# Patient Record
Sex: Male | Born: 1970
Health system: Southern US, Community
[De-identification: ages and names within clinical notes are randomized; demographics above are authoritative.]

## PROBLEM LIST (undated history)

## (undated) DIAGNOSIS — I10 Essential (primary) hypertension: Secondary | ICD-10-CM

## (undated) DIAGNOSIS — J45909 Unspecified asthma, uncomplicated: Secondary | ICD-10-CM

## (undated) DIAGNOSIS — K219 Gastro-esophageal reflux disease without esophagitis: Secondary | ICD-10-CM

## (undated) DIAGNOSIS — F988 Other specified behavioral and emotional disorders with onset usually occurring in childhood and adolescence: Secondary | ICD-10-CM

## (undated) DIAGNOSIS — E785 Hyperlipidemia, unspecified: Secondary | ICD-10-CM

## (undated) HISTORY — DX: Gastro-esophageal reflux disease without esophagitis: K21.9

## (undated) HISTORY — DX: Hyperlipidemia, unspecified: E78.5

## (undated) HISTORY — DX: Unspecified asthma, uncomplicated: J45.909

## (undated) HISTORY — DX: Other specified behavioral and emotional disorders with onset usually occurring in childhood and adolescence: F98.8

## (undated) HISTORY — PX: VASECTOMY: SHX75

## (undated) HISTORY — DX: Essential (primary) hypertension: I10

---

## 2017-05-06 ENCOUNTER — Encounter: Payer: Self-pay | Admitting: Adult Health

## 2017-05-06 ENCOUNTER — Other Ambulatory Visit: Payer: Self-pay | Admitting: Adult Health

## 2017-05-07 DIAGNOSIS — I10 Essential (primary) hypertension: Secondary | ICD-10-CM | POA: Insufficient documentation

## 2017-05-07 DIAGNOSIS — F988 Other specified behavioral and emotional disorders with onset usually occurring in childhood and adolescence: Secondary | ICD-10-CM | POA: Insufficient documentation

## 2017-05-07 DIAGNOSIS — K219 Gastro-esophageal reflux disease without esophagitis: Secondary | ICD-10-CM | POA: Insufficient documentation

## 2017-05-07 DIAGNOSIS — J45909 Unspecified asthma, uncomplicated: Secondary | ICD-10-CM | POA: Insufficient documentation

## 2017-05-07 DIAGNOSIS — E785 Hyperlipidemia, unspecified: Secondary | ICD-10-CM | POA: Insufficient documentation

## 2017-05-07 NOTE — Progress Notes (Signed)
Patient presents to clinic today to establish care. He is a pleasant 47 year old male who  has a past medical history of ADD (attention deficit disorder), Asthma, Essential hypertension, GERD (gastroesophageal reflux disease), and Hyperlipidemia.   Acute Concerns: Establish Care /CPE   Chronic Issues: Essential hypertension - controlled on lisinopril 10 mg daily   Hyperlipidemia - Controlled on statin   ADD - Takes Adderall 15 mg daily - stable   GERD - Takes omeprazole daily - stable   Asthma - Uses Advair  daily and Albuterol PRN   Health Maintenance: Dental -- Routine  Vision -- Does not do routine care Immunizations -- UTD Colonoscopy -- Never had  Diet: Tries to eat healthy  Exercise: Exercies 3-4 times per week    Past Medical History:  Diagnosis Date  . ADD (attention deficit disorder)   . Asthma   . Essential hypertension   . GERD (gastroesophageal reflux disease)   . Hyperlipidemia       Current Outpatient Medications on File Prior to Visit  Medication Sig Dispense Refill  . lisinopril (PRINIVIL,ZESTRIL) 10 MG tablet Take by mouth.     No current facility-administered medications on file prior to visit.     Allergies  Allergen Reactions  . Amoxicillin Anaphylaxis    Family History  Problem Relation Age of Onset  . Hypertension Mother   . High Cholesterol Mother   . High blood pressure Father   . Hyperlipidemia Father   . Early death Brother        mva    Social History   Socioeconomic History  . Marital status: Significant Other    Spouse name: Not on file  . Number of children: Not on file  . Years of education: Not on file  . Highest education level: Not on file  Occupational History  . Occupation: Academic librarian:   Social Needs  . Financial resource strain: Not on file  . Food insecurity:    Worry: Not on file    Inability: Not on file  . Transportation needs:    Medical: Not on file    Non-medical: Not on  file  Tobacco Use  . Smoking status: Not on file  Substance and Sexual Activity  . Alcohol use: Not on file  . Drug use: Not on file  . Sexual activity: Not on file  Lifestyle  . Physical activity:    Days per week: Not on file    Minutes per session: Not on file  . Stress: Not on file  Relationships  . Social connections:    Talks on phone: Not on file    Gets together: Not on file    Attends religious service: Not on file    Active member of club or organization: Not on file    Attends meetings of clubs or organizations: Not on file    Relationship status: Not on file  . Intimate partner violence:    Fear of current or ex partner: Not on file    Emotionally abused: Not on file    Physically abused: Not on file    Forced sexual activity: Not on file  Other Topics Concern  . Not on file  Social History Narrative  . Not on file    Review of Systems  Constitutional: Negative.   HENT: Negative.   Eyes: Negative.   Respiratory: Negative.   Cardiovascular: Negative.   Gastrointestinal: Negative.   Genitourinary: Negative.  Musculoskeletal: Negative.   Skin: Negative.   Neurological: Negative.   Endo/Heme/Allergies: Negative.   Psychiatric/Behavioral: Negative.   All other systems reviewed and are negative.    BP 100/70   Temp 98.6 F (37 C)   Resp 16   Ht 5' 11.5" (1.816 m)   Wt 215 lb (97.5 kg)   BMI 29.57 kg/m   Physical Exam  Constitutional: He is oriented to person, place, and time. No distress.  HENT:  Head: Normocephalic and atraumatic.  Right Ear: External ear normal.  Left Ear: External ear normal.  Nose: Nose normal.  Mouth/Throat: Oropharynx is clear and moist. No oropharyngeal exudate.  Eyes: Pupils are equal, round, and reactive to light. Conjunctivae and EOM are normal. Right eye exhibits no discharge. Left eye exhibits no discharge. No scleral icterus.  Neck: Normal range of motion. Neck supple. No JVD present. No tracheal deviation present.  No thyromegaly present.  Cardiovascular: Normal rate, regular rhythm, normal heart sounds and intact distal pulses. Exam reveals no gallop and no friction rub.  No murmur heard. Pulmonary/Chest: Effort normal and breath sounds normal. No stridor. No respiratory distress. He has no wheezes. He has no rales. He exhibits no tenderness.  Abdominal: Soft. Bowel sounds are normal. He exhibits no distension and no mass. There is no tenderness. There is no rebound and no guarding.  Musculoskeletal: Normal range of motion. He exhibits no edema, tenderness or deformity.  Lymphadenopathy:    He has no cervical adenopathy.  Neurological: He is alert and oriented to person, place, and time. He has normal reflexes. He displays normal reflexes. No cranial nerve deficit. He exhibits normal muscle tone. Coordination normal.  Skin: Skin is warm and dry. No rash noted. He is not diaphoretic. No erythema. No pallor.  Psychiatric: He has a normal mood and affect. His behavior is normal. Judgment and thought content normal.  Nursing note and vitals reviewed.   Assessment/Plan: 1. Routine general medical examination at a health care facility - Follow up in one year or sooner if need - Continue to exercise and eat healthy  - Basic metabolic panel - CBC with Differential/Platelet - Hemoglobin A1c - Hepatic function panel - Lipid panel - TSH  2. Prostate cancer screening  - PSA  3. Mixed hyperlipidemia - Consider increase in statin  - Basic metabolic panel - CBC with Differential/Platelet - Hemoglobin A1c - Hepatic function panel - Lipid panel - TSH - lovastatin (MEVACOR) 20 MG tablet; Take 1 tablet (20 mg total) by mouth at bedtime.  Dispense: 90 tablet; Refill: 3  4. Gastroesophageal reflux disease, esophagitis presence not specified  - omeprazole (PRILOSEC) 20 MG capsule; Take 1 capsule (20 mg total) by mouth daily.  Dispense: 90 capsule; Refill: 3  5. Essential hypertension - Well controlled.  No change in medications  - Basic metabolic panel - CBC with Differential/Platelet - Hemoglobin A1c - Hepatic function panel - Lipid panel - TSH  6. Mild intermittent asthma without complication  - albuterol (PROVENTIL HFA) 108 (90 Base) MCG/ACT inhaler; Inhale 1-2 puffs into the lungs every 6 (six) hours as needed for wheezing or shortness of breath.  Dispense: 6.7 g; Refill: 3 - Fluticasone-Salmeterol (ADVAIR DISKUS) 250-50 MCG/DOSE AEPB; Inhale 1 puff into the lungs 2 (two) times daily.  Dispense: 60 each; Refill: 11  7. Attention deficit disorder, unspecified hyperactivity presence  - amphetamine-dextroamphetamine (ADDERALL) 15 MG tablet; Take 1 tablet by mouth daily.  Dispense: 30 tablet; Refill: 0 - amphetamine-dextroamphetamine (ADDERALL) 15 MG tablet;  Take 1 tablet by mouth daily.  Dispense: 30 tablet; Refill: 0 - amphetamine-dextroamphetamine (ADDERALL) 15 MG tablet; Take 1 tablet by mouth daily.  Dispense: 30 tablet; Refill: 0   Shirline Frees, NP

## 2017-05-08 ENCOUNTER — Ambulatory Visit (INDEPENDENT_AMBULATORY_CARE_PROVIDER_SITE_OTHER): Payer: 59 | Admitting: Adult Health

## 2017-05-08 ENCOUNTER — Encounter: Payer: Self-pay | Admitting: Adult Health

## 2017-05-08 VITALS — BP 100/70 | Temp 98.6°F | Resp 16 | Ht 71.5 in | Wt 215.0 lb

## 2017-05-08 DIAGNOSIS — Z0000 Encounter for general adult medical examination without abnormal findings: Secondary | ICD-10-CM

## 2017-05-08 DIAGNOSIS — F988 Other specified behavioral and emotional disorders with onset usually occurring in childhood and adolescence: Secondary | ICD-10-CM | POA: Diagnosis not present

## 2017-05-08 DIAGNOSIS — Z125 Encounter for screening for malignant neoplasm of prostate: Secondary | ICD-10-CM

## 2017-05-08 DIAGNOSIS — K219 Gastro-esophageal reflux disease without esophagitis: Secondary | ICD-10-CM | POA: Diagnosis not present

## 2017-05-08 DIAGNOSIS — I10 Essential (primary) hypertension: Secondary | ICD-10-CM

## 2017-05-08 DIAGNOSIS — E782 Mixed hyperlipidemia: Secondary | ICD-10-CM

## 2017-05-08 DIAGNOSIS — J4520 Mild intermittent asthma, uncomplicated: Secondary | ICD-10-CM | POA: Diagnosis not present

## 2017-05-08 LAB — CBC WITH DIFFERENTIAL/PLATELET
BASOS ABS: 0 10*3/uL (ref 0.0–0.1)
Basophils Relative: 0.4 % (ref 0.0–3.0)
Eosinophils Absolute: 0.2 10*3/uL (ref 0.0–0.7)
Eosinophils Relative: 3.6 % (ref 0.0–5.0)
HCT: 44.4 % (ref 39.0–52.0)
HEMOGLOBIN: 15.4 g/dL (ref 13.0–17.0)
LYMPHS ABS: 1.9 10*3/uL (ref 0.7–4.0)
Lymphocytes Relative: 31.1 % (ref 12.0–46.0)
MCHC: 34.8 g/dL (ref 30.0–36.0)
MCV: 92 fl (ref 78.0–100.0)
MONO ABS: 0.4 10*3/uL (ref 0.1–1.0)
Monocytes Relative: 7.2 % (ref 3.0–12.0)
NEUTROS PCT: 57.7 % (ref 43.0–77.0)
Neutro Abs: 3.5 10*3/uL (ref 1.4–7.7)
Platelets: 228 10*3/uL (ref 150.0–400.0)
RBC: 4.83 Mil/uL (ref 4.22–5.81)
RDW: 12.8 % (ref 11.5–15.5)
WBC: 6 10*3/uL (ref 4.0–10.5)

## 2017-05-08 LAB — LIPID PANEL
CHOL/HDL RATIO: 5
Cholesterol: 169 mg/dL (ref 0–200)
HDL: 35.9 mg/dL — AB (ref >39.00)
LDL CALC: 115 mg/dL — AB (ref 0–99)
NONHDL: 132.7
Triglycerides: 89 mg/dL (ref 0.0–149.0)
VLDL: 17.8 mg/dL (ref 0.0–40.0)

## 2017-05-08 LAB — BASIC METABOLIC PANEL
BUN: 18 mg/dL (ref 6–23)
CALCIUM: 9.3 mg/dL (ref 8.4–10.5)
CO2: 24 mEq/L (ref 19–32)
Chloride: 105 mEq/L (ref 96–112)
Creatinine, Ser: 0.91 mg/dL (ref 0.40–1.50)
GFR: 94.83 mL/min (ref >60.00)
GLUCOSE: 96 mg/dL (ref 70–99)
Potassium: 4.4 mEq/L (ref 3.5–5.1)
Sodium: 139 mEq/L (ref 135–145)

## 2017-05-08 LAB — HEPATIC FUNCTION PANEL
ALK PHOS: 65 U/L (ref 39–117)
ALT: 42 U/L (ref 0–53)
AST: 23 U/L (ref 0–37)
Albumin: 4.4 g/dL (ref 3.5–5.2)
BILIRUBIN DIRECT: 0.1 mg/dL (ref 0.0–0.3)
Total Bilirubin: 0.5 mg/dL (ref 0.2–1.2)
Total Protein: 6.7 g/dL (ref 6.0–8.3)

## 2017-05-08 LAB — PSA: PSA: 0.29 ng/mL (ref 0.10–4.00)

## 2017-05-08 LAB — TSH: TSH: 1.58 u[IU]/mL (ref 0.35–4.50)

## 2017-05-08 LAB — HEMOGLOBIN A1C: HEMOGLOBIN A1C: 5.6 % (ref 4.6–6.5)

## 2017-05-08 MED ORDER — AMPHETAMINE-DEXTROAMPHETAMINE 15 MG PO TABS
15.0000 mg | ORAL_TABLET | Freq: Every day | ORAL | 0 refills | Status: DC
Start: 2017-05-08 — End: 2017-12-30

## 2017-05-08 MED ORDER — OMEPRAZOLE 20 MG PO CPDR: 20.0000 mg | DELAYED_RELEASE_CAPSULE | Freq: Every day | ORAL | 3 refills | Status: DC

## 2017-05-08 MED ORDER — FLUTICASONE-SALMETEROL 250-50 MCG/DOSE IN AEPB: 1.0000 | INHALATION_SPRAY | Freq: Two times a day (BID) | RESPIRATORY_TRACT | 11 refills | Status: DC

## 2017-05-08 MED ORDER — ALBUTEROL SULFATE HFA 108 (90 BASE) MCG/ACT IN AERS: 1.0000 | INHALATION_SPRAY | Freq: Four times a day (QID) | RESPIRATORY_TRACT | 3 refills | Status: DC | PRN

## 2017-05-08 MED ORDER — AMPHETAMINE-DEXTROAMPHETAMINE 15 MG PO TABS: 15.0000 mg | ORAL_TABLET | Freq: Every day | ORAL | 0 refills | Status: DC

## 2017-05-08 MED ORDER — LOVASTATIN 20 MG PO TABS: 20.0000 mg | ORAL_TABLET | Freq: Every day | ORAL | 3 refills | Status: DC

## 2017-05-08 NOTE — Patient Instructions (Signed)
It was great meeting you today   I will follow up with you regarding your blood work   All of your medications have been sent to the pharmacy

## 2017-05-21 ENCOUNTER — Other Ambulatory Visit: Payer: Self-pay | Admitting: Adult Health

## 2017-05-21 MED ORDER — LISINOPRIL 10 MG PO TABS: 10.0000 mg | ORAL_TABLET | Freq: Every day | ORAL | 3 refills | Status: DC

## 2017-05-21 MED ORDER — ALBUTEROL SULFATE HFA 108 (90 BASE) MCG/ACT IN AERS: 2.0000 | INHALATION_SPRAY | Freq: Four times a day (QID) | RESPIRATORY_TRACT | 2 refills | Status: DC | PRN

## 2017-05-21 MED FILL — VENTOLIN HFA 90 MCG INHALER: 108 (90 BAS | 30 days supply | Qty: 18 | Fill #0 | Status: TO

## 2017-05-21 MED FILL — LISINOPRIL 10 MG TABLET: 10 | 90 days supply | Qty: 90 | Fill #0

## 2017-05-21 MED FILL — DEXTROAMP-AMPHETAMIN 15 MG: 15 | 30 days supply | Qty: 30 | Fill #0

## 2017-05-21 MED FILL — ADVAIR 250/50 DISKUS: 250-50 | 30 days supply | Qty: 60 | Fill #0 | Status: TO

## 2017-05-21 MED FILL — LOVASTATIN 20 MG TABS: 20 | 90 days supply | Qty: 90 | Fill #0

## 2017-05-21 MED FILL — OMEPRAZOLE 20 MG CAP: 20 | 90 days supply | Qty: 90 | Fill #0

## 2017-06-25 ENCOUNTER — Encounter: Payer: Self-pay | Admitting: Adult Health

## 2017-06-26 ENCOUNTER — Other Ambulatory Visit (INDEPENDENT_AMBULATORY_CARE_PROVIDER_SITE_OTHER): Payer: 59

## 2017-06-26 ENCOUNTER — Other Ambulatory Visit: Payer: Self-pay | Admitting: Family Medicine

## 2017-06-26 DIAGNOSIS — Z0184 Encounter for antibody response examination: Secondary | ICD-10-CM

## 2017-06-27 ENCOUNTER — Ambulatory Visit: Payer: 59

## 2017-06-28 LAB — QUANTIFERON-TB GOLD PLUS
NIL: 0.01 IU/mL
QuantiFERON-TB Gold Plus: NEGATIVE
TB1-NIL: 0 IU/mL
TB2-NIL: 0 IU/mL

## 2017-08-05 ENCOUNTER — Other Ambulatory Visit: Payer: Self-pay | Admitting: Adult Health

## 2017-08-05 MED ORDER — METHYLPREDNISOLONE 4 MG PO TBPK: ORAL_TABLET | ORAL | 0 refills | Status: DC

## 2017-08-05 MED FILL — LISINOPRIL 10 MG TABLET: 10 | 90 days supply | Qty: 90 | Fill #1 | Status: TO

## 2017-08-05 MED FILL — LOVASTATIN 20 MG TABS: 20 | 90 days supply | Qty: 90 | Fill #1 | Status: TO

## 2017-08-05 MED FILL — OMEPRAZOLE 20 MG CAP: 20 | 90 days supply | Qty: 90 | Fill #1 | Status: TO

## 2017-08-05 MED FILL — METHYLPREDNISOLONE 4 MG TAB: 4 | 6 days supply | Qty: 21 | Fill #0

## 2017-11-13 ENCOUNTER — Other Ambulatory Visit: Payer: Self-pay | Admitting: Adult Health

## 2017-11-13 MED ORDER — ACYCLOVIR 400 MG PO TABS: 400.0000 mg | ORAL_TABLET | Freq: Three times a day (TID) | ORAL | 0 refills | Status: DC

## 2017-11-30 ENCOUNTER — Other Ambulatory Visit: Payer: Self-pay | Admitting: Adult Health

## 2017-11-30 MED ORDER — PROMETHAZINE HCL 25 MG PO TABS: 25.0000 mg | ORAL_TABLET | Freq: Three times a day (TID) | ORAL | 1 refills | Status: DC | PRN

## 2017-12-06 ENCOUNTER — Other Ambulatory Visit: Payer: Self-pay | Admitting: Adult Health

## 2017-12-06 NOTE — Telephone Encounter (Signed)
Sent to the pharmacy by e-scribe. 

## 2017-12-30 ENCOUNTER — Other Ambulatory Visit: Payer: Self-pay | Admitting: Adult Health

## 2017-12-30 DIAGNOSIS — F988 Other specified behavioral and emotional disorders with onset usually occurring in childhood and adolescence: Secondary | ICD-10-CM

## 2017-12-30 MED ORDER — AMPHETAMINE-DEXTROAMPHETAMINE 15 MG PO TABS: 15.0000 mg | ORAL_TABLET | Freq: Every day | ORAL | 0 refills | Status: DC

## 2018-04-08 ENCOUNTER — Other Ambulatory Visit: Payer: Self-pay | Admitting: Adult Health

## 2018-04-08 DIAGNOSIS — E782 Mixed hyperlipidemia: Secondary | ICD-10-CM

## 2018-04-08 DIAGNOSIS — K219 Gastro-esophageal reflux disease without esophagitis: Secondary | ICD-10-CM

## 2018-04-08 MED ORDER — OMEPRAZOLE 20 MG PO CPDR: 20.0000 mg | DELAYED_RELEASE_CAPSULE | Freq: Every day | ORAL | 3 refills | Status: DC

## 2018-04-08 MED ORDER — LISINOPRIL 10 MG PO TABS: 10.0000 mg | ORAL_TABLET | Freq: Every day | ORAL | 3 refills | Status: DC

## 2018-04-08 MED ORDER — LOVASTATIN 20 MG PO TABS: 20.0000 mg | ORAL_TABLET | Freq: Every day | ORAL | 3 refills | Status: DC

## 2018-04-22 ENCOUNTER — Encounter: Payer: Self-pay | Admitting: Adult Health

## 2018-04-22 ENCOUNTER — Other Ambulatory Visit: Payer: Self-pay

## 2018-04-22 ENCOUNTER — Ambulatory Visit (INDEPENDENT_AMBULATORY_CARE_PROVIDER_SITE_OTHER): Payer: BLUE CROSS/BLUE SHIELD | Admitting: Adult Health

## 2018-04-22 DIAGNOSIS — J189 Pneumonia, unspecified organism: Secondary | ICD-10-CM

## 2018-04-22 MED ORDER — ALBUTEROL SULFATE (2.5 MG/3ML) 0.083% IN NEBU: 2.5000 mg | INHALATION_SOLUTION | Freq: Four times a day (QID) | RESPIRATORY_TRACT | 1 refills | Status: DC | PRN

## 2018-04-22 MED ORDER — ALBUTEROL SULFATE HFA 108 (90 BASE) MCG/ACT IN AERS: 2.0000 | INHALATION_SPRAY | Freq: Four times a day (QID) | RESPIRATORY_TRACT | 2 refills | Status: DC | PRN

## 2018-04-22 MED ORDER — DOXYCYCLINE HYCLATE 100 MG PO CAPS: 100.0000 mg | ORAL_CAPSULE | Freq: Two times a day (BID) | ORAL | 0 refills | Status: DC

## 2018-04-22 NOTE — Progress Notes (Signed)
Virtual Visit via Video Note  I connected with Johnny Michael on 04/22/18 at  2:00 PM EDT by a video enabled telemedicine application and verified that I am speaking with the correct person using two identifiers.  Location patient: home Location provider:work or home office Persons participating in the virtual visit: patient, provider  I discussed the limitations of evaluation and management by telemedicine and the availability of in person appointments. The patient expressed understanding and agreed to proceed.   HPI: 48 year old male who is being evaluated today for concern of upper respiratory infection.  His symptoms are did approximately 3 to 4 days ago and include low-grade fever up to 99.9, shortness of breath and wheezing with exertion- having to use his rescue inhaler more frequently, productive cough with green sputum, and a feeling of "rattling in my chest "when he coughs.  He does have a history of pneumonia and feels as though this feels similar.  He denies any chest pain, nausea, vomiting, or diarrhea body aches, or sore throat.  He does work from home   ROS: See pertinent positives and negatives per HPI.  Past Medical History:  Diagnosis Date  . ADD (attention deficit disorder)   . Asthma   . Essential hypertension   . GERD (gastroesophageal reflux disease)   . Hyperlipidemia     Past Surgical History:  Procedure Laterality Date  . VASECTOMY      Family History  Problem Relation Age of Onset  . Hypertension Mother   . High Cholesterol Mother   . High blood pressure Father   . Hyperlipidemia Father   . Early death Brother        mva      Current Outpatient Medications:  .  albuterol (PROVENTIL HFA;VENTOLIN HFA) 108 (90 Base) MCG/ACT inhaler, Inhale 2 puffs into the lungs every 6 (six) hours as needed for wheezing or shortness of breath., Disp: 1 Inhaler, Rfl: 2 .  albuterol (PROVENTIL) (2.5 MG/3ML) 0.083% nebulizer solution, Take 3 mLs (2.5 mg total) by  nebulization every 6 (six) hours as needed for wheezing or shortness of breath., Disp: 150 mL, Rfl: 1 .  amphetamine-dextroamphetamine (ADDERALL) 15 MG tablet, Take 1 tablet by mouth daily., Disp: 30 tablet, Rfl: 0 .  amphetamine-dextroamphetamine (ADDERALL) 15 MG tablet, Take 1 tablet by mouth daily., Disp: 30 tablet, Rfl: 0 .  amphetamine-dextroamphetamine (ADDERALL) 15 MG tablet, Take 1 tablet by mouth daily., Disp: 30 tablet, Rfl: 0 .  doxycycline (VIBRAMYCIN) 100 MG capsule, Take 1 capsule (100 mg total) by mouth 2 (two) times daily., Disp: 20 capsule, Rfl: 0 .  Fluticasone-Salmeterol (ADVAIR DISKUS) 250-50 MCG/DOSE AEPB, Inhale 1 puff into the lungs 2 (two) times daily., Disp: 60 each, Rfl: 11 .  lisinopril (PRINIVIL,ZESTRIL) 10 MG tablet, Take 1 tablet (10 mg total) by mouth daily., Disp: 90 tablet, Rfl: 3 .  lovastatin (MEVACOR) 20 MG tablet, Take 1 tablet (20 mg total) by mouth at bedtime., Disp: 90 tablet, Rfl: 3 .  omeprazole (PRILOSEC) 20 MG capsule, Take 1 capsule (20 mg total) by mouth daily., Disp: 90 capsule, Rfl: 3  EXAM:  VITALS per patient if applicable:  GENERAL: alert, oriented, appears well and in no acute distress  HEENT: atraumatic, conjunttiva clear, no obvious abnormalities on inspection of external nose and ears  NECK: normal movements of the head and neck  LUNGS: on inspection no signs of respiratory distress, breathing rate appears normal, no obvious gross SOB, gasping or wheezing  CV: no obvious cyanosis  MS: moves all visible extremities without noticeable abnormality  PSYCH/NEURO: pleasant and cooperative, no obvious depression or anxiety, speech and thought processing grossly intact  ASSESSMENT AND PLAN:  Discussed the following assessment and plan:  Pneumonia due to infectious organism, unspecified laterality, unspecified part of lung - Plan: doxycycline (VIBRAMYCIN) 100 MG capsule, albuterol (PROVENTIL HFA;VENTOLIN HFA) 108 (90 Base) MCG/ACT inhaler,  albuterol (PROVENTIL) (2.5 MG/3ML) 0.083% nebulizer solution     I discussed the assessment and treatment plan with the patient. The patient was provided an opportunity to ask questions and all were answered. The patient agreed with the plan and demonstrated an understanding of the instructions.   The patient was advised to call back or seek an in-person evaluation if the symptoms worsen or if the condition fails to improve as anticipated.    Shirline Frees, NP

## 2018-04-28 ENCOUNTER — Other Ambulatory Visit: Payer: Self-pay | Admitting: Adult Health

## 2018-04-28 DIAGNOSIS — R0602 Shortness of breath: Secondary | ICD-10-CM

## 2018-04-29 ENCOUNTER — Other Ambulatory Visit: Payer: Self-pay | Admitting: Family Medicine

## 2018-04-29 ENCOUNTER — Other Ambulatory Visit: Payer: Self-pay

## 2018-04-29 ENCOUNTER — Ambulatory Visit
Admission: RE | Admit: 2018-04-29 | Discharge: 2018-04-29 | Disposition: A | Discharge status: Home / Self Care | Payer: BLUE CROSS/BLUE SHIELD | Source: Ambulatory Visit | Attending: Adult Health | Admitting: Adult Health

## 2018-04-29 ENCOUNTER — Other Ambulatory Visit (INDEPENDENT_AMBULATORY_CARE_PROVIDER_SITE_OTHER): Payer: BLUE CROSS/BLUE SHIELD

## 2018-04-29 ENCOUNTER — Other Ambulatory Visit: Payer: Self-pay | Admitting: Adult Health

## 2018-04-29 DIAGNOSIS — R0602 Shortness of breath: Secondary | ICD-10-CM

## 2018-04-29 LAB — CBC WITH DIFFERENTIAL/PLATELET
Basophils Absolute: 0 10*3/uL (ref 0.0–0.1)
Basophils Relative: 0.5 % (ref 0.0–3.0)
Eosinophils Absolute: 0.2 10*3/uL (ref 0.0–0.7)
Eosinophils Relative: 2.4 % (ref 0.0–5.0)
HCT: 43.7 % (ref 39.0–52.0)
Hemoglobin: 15.1 g/dL (ref 13.0–17.0)
Lymphocytes Relative: 28.4 % (ref 12.0–46.0)
Lymphs Abs: 2.2 10*3/uL (ref 0.7–4.0)
MCHC: 34.6 g/dL (ref 30.0–36.0)
MCV: 93 fl (ref 78.0–100.0)
Monocytes Absolute: 0.6 10*3/uL (ref 0.1–1.0)
Monocytes Relative: 7.8 % (ref 3.0–12.0)
Neutro Abs: 4.6 10*3/uL (ref 1.4–7.7)
Neutrophils Relative %: 60.9 % (ref 43.0–77.0)
Platelets: 260 10*3/uL (ref 150.0–400.0)
RBC: 4.7 Mil/uL (ref 4.22–5.81)
RDW: 13.1 % (ref 11.5–15.5)
WBC: 7.6 10*3/uL (ref 4.0–10.5)

## 2018-04-29 LAB — HEPATIC FUNCTION PANEL
ALT: 41 U/L (ref 0–53)
AST: 19 U/L (ref 0–37)
Albumin: 4.6 g/dL (ref 3.5–5.2)
Alkaline Phosphatase: 71 U/L (ref 39–117)
Bilirubin, Direct: 0.1 mg/dL (ref 0.0–0.3)
Total Bilirubin: 0.3 mg/dL (ref 0.2–1.2)
Total Protein: 6.8 g/dL (ref 6.0–8.3)

## 2018-04-29 LAB — BASIC METABOLIC PANEL
BUN: 16 mg/dL (ref 6–23)
CO2: 25 mEq/L (ref 19–32)
Calcium: 9.7 mg/dL (ref 8.4–10.5)
Chloride: 105 mEq/L (ref 96–112)
Creatinine, Ser: 0.9 mg/dL (ref 0.40–1.50)
GFR: 89.99 mL/min (ref >60.00)
Glucose, Bld: 80 mg/dL (ref 70–99)
Potassium: 4.3 mEq/L (ref 3.5–5.1)
Sodium: 139 mEq/L (ref 135–145)

## 2018-04-29 MED ORDER — ONDANSETRON HCL 4 MG PO TABS: 4.0000 mg | ORAL_TABLET | Freq: Three times a day (TID) | ORAL | 3 refills | Status: DC | PRN

## 2018-04-30 DIAGNOSIS — R0602 Shortness of breath: Secondary | ICD-10-CM | POA: Diagnosis not present

## 2018-04-30 DIAGNOSIS — R05 Cough: Secondary | ICD-10-CM | POA: Diagnosis not present

## 2018-04-30 DIAGNOSIS — Z20828 Contact with and (suspected) exposure to other viral communicable diseases: Secondary | ICD-10-CM | POA: Diagnosis not present

## 2018-05-14 ENCOUNTER — Other Ambulatory Visit: Payer: Self-pay | Admitting: Adult Health

## 2018-05-14 DIAGNOSIS — J4520 Mild intermittent asthma, uncomplicated: Secondary | ICD-10-CM

## 2018-05-14 MED ORDER — FLUTICASONE-SALMETEROL 250-50 MCG/DOSE IN AEPB: 1.0000 | INHALATION_SPRAY | Freq: Two times a day (BID) | RESPIRATORY_TRACT | 11 refills | Status: DC

## 2018-07-16 ENCOUNTER — Encounter: Payer: Self-pay | Admitting: Adult Health

## 2018-07-16 ENCOUNTER — Ambulatory Visit (INDEPENDENT_AMBULATORY_CARE_PROVIDER_SITE_OTHER): Payer: BC Managed Care – PPO | Admitting: Adult Health

## 2018-07-16 ENCOUNTER — Other Ambulatory Visit: Payer: Self-pay

## 2018-07-16 DIAGNOSIS — R000 Tachycardia, unspecified: Secondary | ICD-10-CM

## 2018-07-16 NOTE — Progress Notes (Signed)
Virtual Visit via Video Note  I connected with Johnny Michael on 07/16/18 at  4:00 PM EDT by a video enabled telemedicine application and verified that I am speaking with the correct person using two identifiers.  Location patient: home Location provider:work or home office Persons participating in the virtual visit: patient, provider  I discussed the limitations of evaluation and management by telemedicine and the availability of in person appointments. The patient expressed understanding and agreed to proceed.   HPI: 48 year old male evaluated today for symptomatic tachycardia.  Per patient report over the last 2 to 3 days he has been experiencing fatigue, lightheadedness and near syncopal episodes.  He reports that he has been monitoring his pulse rate with his apple watch and while sitting his heart rate is usually between 110 and 120 with minimal exertion such as walking his heart rate increases to 150-160.  Today when he walked to get the garbage can from the end of the driveway he bent over to pull some weeds and had a near syncopal episode.  He denies chest pain or shortness of breath  Has not been monitoring his blood pressure at home so he does not know if he is orthostatic.  He has not been taking Adderall on a daily basis   ROS: See pertinent positives and negatives per HPI.  Past Medical History:  Diagnosis Date  . ADD (attention deficit disorder)   . Asthma   . Essential hypertension   . GERD (gastroesophageal reflux disease)   . Hyperlipidemia     Past Surgical History:  Procedure Laterality Date  . VASECTOMY      Family History  Problem Relation Age of Onset  . Hypertension Mother   . High Cholesterol Mother   . High blood pressure Father   . Hyperlipidemia Father   . Early death Brother        mva      Current Outpatient Medications:  .  albuterol (PROVENTIL HFA;VENTOLIN HFA) 108 (90 Base) MCG/ACT inhaler, Inhale 2 puffs into the lungs every 6 (six)  hours as needed for wheezing or shortness of breath., Disp: 1 Inhaler, Rfl: 2 .  albuterol (PROVENTIL) (2.5 MG/3ML) 0.083% nebulizer solution, Take 3 mLs (2.5 mg total) by nebulization every 6 (six) hours as needed for wheezing or shortness of breath., Disp: 150 mL, Rfl: 1 .  amphetamine-dextroamphetamine (ADDERALL) 15 MG tablet, Take 1 tablet by mouth daily., Disp: 30 tablet, Rfl: 0 .  amphetamine-dextroamphetamine (ADDERALL) 15 MG tablet, Take 1 tablet by mouth daily., Disp: 30 tablet, Rfl: 0 .  amphetamine-dextroamphetamine (ADDERALL) 15 MG tablet, Take 1 tablet by mouth daily., Disp: 30 tablet, Rfl: 0 .  doxycycline (VIBRAMYCIN) 100 MG capsule, Take 1 capsule (100 mg total) by mouth 2 (two) times daily., Disp: 20 capsule, Rfl: 0 .  Fluticasone-Salmeterol (ADVAIR DISKUS) 250-50 MCG/DOSE AEPB, Inhale 1 puff into the lungs 2 (two) times daily., Disp: 60 each, Rfl: 11 .  lisinopril (PRINIVIL,ZESTRIL) 10 MG tablet, Take 1 tablet (10 mg total) by mouth daily., Disp: 90 tablet, Rfl: 3 .  lovastatin (MEVACOR) 20 MG tablet, Take 1 tablet (20 mg total) by mouth at bedtime., Disp: 90 tablet, Rfl: 3 .  omeprazole (PRILOSEC) 20 MG capsule, Take 1 capsule (20 mg total) by mouth daily., Disp: 90 capsule, Rfl: 3 .  ondansetron (ZOFRAN) 4 MG tablet, Take 1 tablet (4 mg total) by mouth every 8 (eight) hours as needed for nausea or vomiting., Disp: 20 tablet, Rfl: 3  EXAM:  VITALS per patient if applicable:  GENERAL: alert, oriented, appears well and in no acute distress  HEENT: atraumatic, conjunttiva clear, no obvious abnormalities on inspection of external nose and ears  NECK: normal movements of the head and neck  LUNGS: on inspection no signs of respiratory distress, breathing rate appears normal, no obvious gross SOB, gasping or wheezing  CV: no obvious cyanosis  MS: moves all visible extremities without noticeable abnormality  PSYCH/NEURO: pleasant and cooperative, no obvious depression or  anxiety, speech and thought processing grossly intact  ASSESSMENT AND PLAN:  Discussed the following assessment and plan:  1. Tachycardia -Advised ER or urgent care visit.  He would like to start off at urgent care.  Advised to stay hydrated.  Will refer to cardiology for symptomatic tachycardia - Ambulatory referral to Cardiology  I discussed the assessment and treatment plan with the patient. The patient was provided an opportunity to ask questions and all were answered. The patient agreed with the plan and demonstrated an understanding of the instructions.   The patient was advised to call back or seek an in-person evaluation if the symptoms worsen or if the condition fails to improve as anticipated.   Shirline Freesory Ronan Dion, NP

## 2018-07-17 DIAGNOSIS — R002 Palpitations: Secondary | ICD-10-CM | POA: Diagnosis not present

## 2018-07-17 DIAGNOSIS — R42 Dizziness and giddiness: Secondary | ICD-10-CM | POA: Diagnosis not present

## 2018-07-17 DIAGNOSIS — Z8679 Personal history of other diseases of the circulatory system: Secondary | ICD-10-CM | POA: Diagnosis not present

## 2018-07-22 ENCOUNTER — Other Ambulatory Visit: Payer: Self-pay | Admitting: Adult Health

## 2018-07-22 DIAGNOSIS — D3500 Benign neoplasm of unspecified adrenal gland: Secondary | ICD-10-CM | POA: Diagnosis not present

## 2018-07-22 DIAGNOSIS — R000 Tachycardia, unspecified: Secondary | ICD-10-CM | POA: Diagnosis not present

## 2018-07-23 LAB — ACTH: ACTH: 13 pg/mL (ref 7.2–63.3)

## 2018-07-23 LAB — CBC
Hematocrit: 42.3 % (ref 37.5–51.0)
Hemoglobin: 15.1 g/dL (ref 13.0–17.7)
MCH: 32.6 pg (ref 26.6–33.0)
MCHC: 35.7 g/dL (ref 31.5–35.7)
MCV: 91 fL (ref 79–97)
Platelets: 259 10*3/uL (ref 150–450)
RBC: 4.63 x10E6/uL (ref 4.14–5.80)
RDW: 12 % (ref 11.6–15.4)
WBC: 6.4 10*3/uL (ref 3.4–10.8)

## 2018-07-23 LAB — COMPREHENSIVE METABOLIC PANEL
ALT: 55 IU/L — ABNORMAL HIGH (ref 0–44)
AST: 28 IU/L (ref 0–40)
Albumin/Globulin Ratio: 2.2 (ref 1.2–2.2)
Albumin: 4.7 g/dL (ref 4.0–5.0)
Alkaline Phosphatase: 75 IU/L (ref 39–117)
BUN/Creatinine Ratio: 11 (ref 9–20)
BUN: 10 mg/dL (ref 6–24)
Bilirubin Total: 0.4 mg/dL (ref 0.0–1.2)
CO2: 20 mmol/L (ref 20–29)
Calcium: 9.6 mg/dL (ref 8.7–10.2)
Chloride: 103 mmol/L (ref 96–106)
Creatinine, Ser: 0.91 mg/dL (ref 0.76–1.27)
GFR calc Af Amer: 115 mL/min/{1.73_m2} (ref >59)
GFR calc non Af Amer: 99 mL/min/{1.73_m2} (ref >59)
Globulin, Total: 2.1 g/dL (ref 1.5–4.5)
Glucose: 80 mg/dL (ref 65–99)
Potassium: 4.1 mmol/L (ref 3.5–5.2)
Sodium: 139 mmol/L (ref 134–144)
Total Protein: 6.8 g/dL (ref 6.0–8.5)

## 2018-07-23 LAB — TSH: TSH: 1.55 u[IU]/mL (ref 0.450–4.500)

## 2018-08-02 ENCOUNTER — Other Ambulatory Visit: Payer: Self-pay | Admitting: Adult Health

## 2018-08-02 MED ORDER — PREDNISONE 10 MG PO TABS: ORAL_TABLET | ORAL | 0 refills | Status: DC

## 2018-08-02 MED ORDER — DIAZEPAM 5 MG PO TABS: 5.0000 mg | ORAL_TABLET | Freq: Two times a day (BID) | ORAL | 0 refills | Status: DC | PRN

## 2018-08-06 DIAGNOSIS — I10 Essential (primary) hypertension: Secondary | ICD-10-CM | POA: Diagnosis not present

## 2018-08-06 DIAGNOSIS — E7800 Pure hypercholesterolemia, unspecified: Secondary | ICD-10-CM | POA: Diagnosis not present

## 2018-08-06 DIAGNOSIS — Z6829 Body mass index (BMI) 29.0-29.9, adult: Secondary | ICD-10-CM | POA: Diagnosis not present

## 2018-08-06 DIAGNOSIS — R000 Tachycardia, unspecified: Secondary | ICD-10-CM | POA: Diagnosis not present

## 2018-08-29 DIAGNOSIS — R000 Tachycardia, unspecified: Secondary | ICD-10-CM | POA: Diagnosis not present

## 2018-08-29 DIAGNOSIS — I10 Essential (primary) hypertension: Secondary | ICD-10-CM | POA: Diagnosis not present

## 2018-08-29 DIAGNOSIS — E7800 Pure hypercholesterolemia, unspecified: Secondary | ICD-10-CM | POA: Diagnosis not present

## 2018-09-10 ENCOUNTER — Other Ambulatory Visit: Payer: Self-pay | Admitting: Adult Health

## 2018-09-10 DIAGNOSIS — I498 Other specified cardiac arrhythmias: Secondary | ICD-10-CM

## 2018-09-10 DIAGNOSIS — G90A Postural orthostatic tachycardia syndrome (POTS): Secondary | ICD-10-CM

## 2018-09-13 ENCOUNTER — Other Ambulatory Visit: Payer: Self-pay | Admitting: Adult Health

## 2018-11-11 DIAGNOSIS — I498 Other specified cardiac arrhythmias: Secondary | ICD-10-CM | POA: Diagnosis not present

## 2018-11-11 DIAGNOSIS — I10 Essential (primary) hypertension: Secondary | ICD-10-CM | POA: Diagnosis not present

## 2018-11-11 DIAGNOSIS — E7800 Pure hypercholesterolemia, unspecified: Secondary | ICD-10-CM | POA: Diagnosis not present

## 2018-11-11 DIAGNOSIS — R000 Tachycardia, unspecified: Secondary | ICD-10-CM | POA: Diagnosis not present

## 2018-11-24 DIAGNOSIS — I498 Other specified cardiac arrhythmias: Secondary | ICD-10-CM | POA: Diagnosis not present

## 2018-12-12 DIAGNOSIS — R55 Syncope and collapse: Secondary | ICD-10-CM | POA: Diagnosis not present

## 2018-12-12 DIAGNOSIS — G901 Familial dysautonomia [Riley-Day]: Secondary | ICD-10-CM | POA: Diagnosis not present

## 2018-12-12 DIAGNOSIS — R42 Dizziness and giddiness: Secondary | ICD-10-CM | POA: Diagnosis not present

## 2019-01-11 ENCOUNTER — Encounter: Payer: Self-pay | Admitting: Adult Health

## 2019-01-14 ENCOUNTER — Other Ambulatory Visit: Payer: Self-pay | Admitting: Adult Health

## 2019-01-14 DIAGNOSIS — F988 Other specified behavioral and emotional disorders with onset usually occurring in childhood and adolescence: Secondary | ICD-10-CM

## 2019-01-14 MED ORDER — AMPHETAMINE-DEXTROAMPHETAMINE 15 MG PO TABS: 15.0000 mg | ORAL_TABLET | Freq: Every day | ORAL | 0 refills | Status: DC

## 2019-02-17 DIAGNOSIS — Z1152 Encounter for screening for COVID-19: Secondary | ICD-10-CM | POA: Diagnosis not present

## 2019-03-10 DIAGNOSIS — R55 Syncope and collapse: Secondary | ICD-10-CM | POA: Diagnosis not present

## 2019-04-13 ENCOUNTER — Other Ambulatory Visit: Payer: Self-pay | Admitting: Adult Health

## 2019-04-13 MED ORDER — INDOMETHACIN 50 MG PO CAPS: 50.0000 mg | ORAL_CAPSULE | Freq: Three times a day (TID) | ORAL | 0 refills | Status: DC

## 2019-04-13 MED ORDER — PREDNISONE 10 MG PO TABS: ORAL_TABLET | ORAL | 0 refills | Status: DC

## 2019-05-07 ENCOUNTER — Other Ambulatory Visit: Payer: Self-pay | Admitting: Adult Health

## 2019-05-07 DIAGNOSIS — F988 Other specified behavioral and emotional disorders with onset usually occurring in childhood and adolescence: Secondary | ICD-10-CM

## 2019-05-07 DIAGNOSIS — K219 Gastro-esophageal reflux disease without esophagitis: Secondary | ICD-10-CM

## 2019-05-07 DIAGNOSIS — J189 Pneumonia, unspecified organism: Secondary | ICD-10-CM

## 2019-05-07 DIAGNOSIS — J4520 Mild intermittent asthma, uncomplicated: Secondary | ICD-10-CM

## 2019-05-07 DIAGNOSIS — E782 Mixed hyperlipidemia: Secondary | ICD-10-CM

## 2019-05-07 MED ORDER — AMPHETAMINE-DEXTROAMPHETAMINE 15 MG PO TABS: 15.0000 mg | ORAL_TABLET | Freq: Every day | ORAL | 0 refills | Status: DC

## 2019-05-07 MED ORDER — NADOLOL 20 MG PO TABS: 10.0000 mg | ORAL_TABLET | Freq: Every day | ORAL | 3 refills | Status: DC

## 2019-05-07 MED ORDER — OMEPRAZOLE 20 MG PO CPDR: 20.0000 mg | DELAYED_RELEASE_CAPSULE | Freq: Every day | ORAL | 3 refills | Status: DC

## 2019-05-07 MED ORDER — LOVASTATIN 20 MG PO TABS: 20.0000 mg | ORAL_TABLET | Freq: Every day | ORAL | 3 refills | Status: DC

## 2019-05-07 MED ORDER — LISINOPRIL 10 MG PO TABS: 10.0000 mg | ORAL_TABLET | Freq: Every day | ORAL | 3 refills | Status: DC

## 2019-05-07 MED ORDER — ALBUTEROL SULFATE HFA 108 (90 BASE) MCG/ACT IN AERS: 2.0000 | INHALATION_SPRAY | Freq: Four times a day (QID) | RESPIRATORY_TRACT | 3 refills | Status: DC | PRN

## 2019-05-07 MED ORDER — FLUTICASONE-SALMETEROL 250-50 MCG/DOSE IN AEPB: 1.0000 | INHALATION_SPRAY | Freq: Two times a day (BID) | RESPIRATORY_TRACT | 11 refills | Status: DC

## 2019-05-20 ENCOUNTER — Other Ambulatory Visit: Payer: Self-pay

## 2019-05-21 ENCOUNTER — Ambulatory Visit (INDEPENDENT_AMBULATORY_CARE_PROVIDER_SITE_OTHER): Payer: BC Managed Care – PPO | Admitting: Adult Health

## 2019-05-21 ENCOUNTER — Encounter: Payer: Self-pay | Admitting: Adult Health

## 2019-05-21 VITALS — BP 110/70 | HR 58 | Temp 98.6°F | Wt 220.0 lb

## 2019-05-21 DIAGNOSIS — I10 Essential (primary) hypertension: Secondary | ICD-10-CM

## 2019-05-21 DIAGNOSIS — K219 Gastro-esophageal reflux disease without esophagitis: Secondary | ICD-10-CM

## 2019-05-21 DIAGNOSIS — Z125 Encounter for screening for malignant neoplasm of prostate: Secondary | ICD-10-CM

## 2019-05-21 DIAGNOSIS — J4520 Mild intermittent asthma, uncomplicated: Secondary | ICD-10-CM

## 2019-05-21 DIAGNOSIS — E782 Mixed hyperlipidemia: Secondary | ICD-10-CM

## 2019-05-21 DIAGNOSIS — Z0000 Encounter for general adult medical examination without abnormal findings: Secondary | ICD-10-CM | POA: Diagnosis not present

## 2019-05-21 DIAGNOSIS — R55 Syncope and collapse: Secondary | ICD-10-CM

## 2019-05-21 LAB — CBC WITH DIFFERENTIAL/PLATELET
Basophils Absolute: 0 10*3/uL (ref 0.0–0.1)
Basophils Relative: 0.5 % (ref 0.0–3.0)
Eosinophils Absolute: 0.2 10*3/uL (ref 0.0–0.7)
Eosinophils Relative: 3.3 % (ref 0.0–5.0)
HCT: 43.3 % (ref 39.0–52.0)
Hemoglobin: 14.8 g/dL (ref 13.0–17.0)
Lymphocytes Relative: 30.1 % (ref 12.0–46.0)
Lymphs Abs: 2.2 10*3/uL (ref 0.7–4.0)
MCHC: 34.2 g/dL (ref 30.0–36.0)
MCV: 93.1 fl (ref 78.0–100.0)
Monocytes Absolute: 0.7 10*3/uL (ref 0.1–1.0)
Monocytes Relative: 9.3 % (ref 3.0–12.0)
Neutro Abs: 4.1 10*3/uL (ref 1.4–7.7)
Neutrophils Relative %: 56.8 % (ref 43.0–77.0)
Platelets: 232 10*3/uL (ref 150.0–400.0)
RBC: 4.65 Mil/uL (ref 4.22–5.81)
RDW: 13.6 % (ref 11.5–15.5)
WBC: 7.2 10*3/uL (ref 4.0–10.5)

## 2019-05-21 LAB — COMPREHENSIVE METABOLIC PANEL
ALT: 63 U/L — ABNORMAL HIGH (ref 0–53)
AST: 28 U/L (ref 0–37)
Albumin: 4.4 g/dL (ref 3.5–5.2)
Alkaline Phosphatase: 69 U/L (ref 39–117)
BUN: 10 mg/dL (ref 6–23)
CO2: 29 mEq/L (ref 19–32)
Calcium: 9.4 mg/dL (ref 8.4–10.5)
Chloride: 104 mEq/L (ref 96–112)
Creatinine, Ser: 0.94 mg/dL (ref 0.40–1.50)
GFR: 85.21 mL/min (ref >60.00)
Glucose, Bld: 85 mg/dL (ref 70–99)
Potassium: 4.3 mEq/L (ref 3.5–5.1)
Sodium: 139 mEq/L (ref 135–145)
Total Bilirubin: 0.5 mg/dL (ref 0.2–1.2)
Total Protein: 6.8 g/dL (ref 6.0–8.3)

## 2019-05-21 LAB — HEMOGLOBIN A1C: Hgb A1c MFr Bld: 5.7 % (ref 4.6–6.5)

## 2019-05-21 LAB — TSH: TSH: 1.19 u[IU]/mL (ref 0.35–4.50)

## 2019-05-21 LAB — LIPID PANEL
Cholesterol: 186 mg/dL (ref 0–200)
HDL: 38.2 mg/dL — ABNORMAL LOW (ref >39.00)
LDL Cholesterol: 120 mg/dL — ABNORMAL HIGH (ref 0–99)
NonHDL: 148.08
Total CHOL/HDL Ratio: 5
Triglycerides: 138 mg/dL (ref 0.0–149.0)
VLDL: 27.6 mg/dL (ref 0.0–40.0)

## 2019-05-21 LAB — PSA: PSA: 0.35 ng/mL (ref 0.10–4.00)

## 2019-05-21 NOTE — Progress Notes (Signed)
Subjective:    Patient ID: Johnny Michael, male    DOB: 1970/11/11, 49 y.o.   MRN: [id number]  HPI Patient presents for yearly preventative medicine examination. He is a pleasant 49 year old male who  has a past medical history of ADD (attention deficit disorder), Asthma, Essential hypertension, GERD (gastroesophageal reflux disease), and Hyperlipidemia.     ADHD- Well controlled on Adderall 15 mg.  He takes this on a as needed basis averaging about 2-3 times a week.  Essential Hypertension -currently prescribed lisinopril 10 mg and Corgard 10 mg.  He denies dizziness, lightheadedness, chest pain, or shortness of breath BP Readings from Last 3 Encounters:  05/21/19 110/70  05/08/17 100/70   GERD-takes Prilosec 20 mg daily.  Denies symptoms currently  Hyperlipidemia -well controlled with lovastatin 20 mg daily.  He denies myalgia or fatigue Lab Results  Component Value Date   CHOL 169 05/08/2017   HDL 35.90 (L) 05/08/2017   LDLCALC 115 (H) 05/08/2017   TRIG 89.0 05/08/2017   CHOLHDL 5 05/08/2017   Vasovagal Syncope -has been worked up by Dr. Bonnita Hollow at [facility].  He was diagnosed with vasovagal syncope after a tilt table test was done in October 2020 which was consistent with neurocardiogenic syncope.  At this time he was initiated on low-dose nadolol at 10 mg daily.  Since starting this he does report significant improvement of his symptoms with no further episodes of dizziness or near syncope.  Asthma -uses Advair twice daily and albuterol inhaler as needed.  He denies any recent exacerbations   All immunizations and health maintenance protocols were reviewed with the patient and needed orders were placed.  Appropriate screening laboratory values were ordered for the patient including screening of hyperlipidemia, renal function and hepatic function. If indicated by BPH, a PSA was ordered.  Medication reconciliation,  past medical history, social history, problem  list and allergies were reviewed in detail with the patient  Goals were established with regard to weight loss, exercise, and  diet in compliance with medications Wt Readings from Last 3 Encounters:  05/21/19 220 lb (99.8 kg)  05/08/17 215 lb (97.5 kg)    Review of Systems  Constitutional: Negative.   HENT: Negative.   Eyes: Negative.   Respiratory: Negative.   Cardiovascular: Negative.   Gastrointestinal: Negative.   Endocrine: Negative.   Genitourinary: Negative.   Musculoskeletal: Negative.   Skin: Negative.   Allergic/Immunologic: Negative.   Neurological: Negative.   Hematological: Negative.   Psychiatric/Behavioral: Negative.   All other systems reviewed and are negative.  Past Medical History:  Diagnosis Date  . ADD (attention deficit disorder)   . Asthma   . Essential hypertension   . GERD (gastroesophageal reflux disease)   . Hyperlipidemia     Social History   Socioeconomic History  . Marital status: Single    Spouse name: Not on file  . Number of children: Not on file  . Years of education: Not on file  . Highest education level: Not on file  Occupational History  . Occupation: Nurse    Employer: [employer]  Tobacco Use  . Smoking status: Never Smoker  . Smokeless tobacco: Never Used  Substance and Sexual Activity  . Alcohol use: Not on file    Comment: socially   . Drug use: Never  . Sexual activity: Yes    Partners: Female  Other Topics Concern  . Not on file  Social History Narrative  . Not on  file   Social Determinants of Health   Financial Resource Strain:   . Difficulty of Paying Living Expenses:   Food Insecurity:   . Worried About Programme researcher, broadcasting/film/video in the Last Year:   . Barista in the Last Year:   Transportation Needs:   . Freight forwarder (Medical):   Marland Kitchen Lack of Transportation (Non-Medical):   Physical Activity:   . Days of Exercise per Week:   . Minutes of Exercise per Session:   Stress:   . Feeling of Stress  :   Social Connections:   . Frequency of Communication with Friends and Family:   . Frequency of Social Gatherings with Friends and Family:   . Attends Religious Services:   . Active Member of Clubs or Organizations:   . Attends Banker Meetings:   Marland Kitchen Marital Status:   Intimate Partner Violence:   . Fear of Current or Ex-Partner:   . Emotionally Abused:   Marland Kitchen Physically Abused:   . Sexually Abused:     Past Surgical History:  Procedure Laterality Date  . VASECTOMY      Family History  Problem Relation Age of Onset  . Hypertension Mother   . High Cholesterol Mother   . High blood pressure Father   . Hyperlipidemia Father   . Early death Brother        mva    Allergies  Allergen Reactions  . Amoxicillin Anaphylaxis    Current Outpatient Medications on File Prior to Visit  Medication Sig Dispense Refill  . albuterol (PROVENTIL) (2.5 MG/3ML) 0.083% nebulizer solution Take 3 mLs (2.5 mg total) by nebulization every 6 (six) hours as needed for wheezing or shortness of breath. 150 mL 1  . albuterol (VENTOLIN HFA) 108 (90 Base) MCG/ACT inhaler Inhale 2 puffs into the lungs every 6 (six) hours as needed for wheezing or shortness of breath. 6.7 g 3  . amphetamine-dextroamphetamine (ADDERALL) 15 MG tablet Take 1 tablet by mouth daily. 30 tablet 0  . amphetamine-dextroamphetamine (ADDERALL) 15 MG tablet Take 1 tablet by mouth daily. 30 tablet 0  . amphetamine-dextroamphetamine (ADDERALL) 15 MG tablet Take 1 tablet by mouth daily. 30 tablet 0  . Fluticasone-Salmeterol (ADVAIR DISKUS) 250-50 MCG/DOSE AEPB Inhale 1 puff into the lungs 2 (two) times daily. 60 each 11  . lisinopril (ZESTRIL) 10 MG tablet Take 1 tablet (10 mg total) by mouth daily. 90 tablet 3  . lovastatin (MEVACOR) 20 MG tablet Take 1 tablet (20 mg total) by mouth at bedtime. 90 tablet 3  . nadolol (CORGARD) 20 MG tablet Take 0.5 tablets (10 mg total) by mouth daily. 45 tablet 3  . omeprazole (PRILOSEC) 20 MG  capsule Take 1 capsule (20 mg total) by mouth daily. 90 capsule 3   No current facility-administered medications on file prior to visit.    BP 110/70   Pulse (!) 58   Temp 98.6 F (37 C)   Wt 220 lb (99.8 kg)   BMI 30.26 kg/m       Objective:   Physical Exam Vitals and nursing note reviewed.  Constitutional:      General: He is not in acute distress.    Appearance: Normal appearance. He is well-developed and normal weight.  HENT:     Head: Normocephalic and atraumatic.     Right Ear: Tympanic membrane, ear canal and external ear normal. There is no impacted cerumen.     Left Ear: Tympanic membrane, ear canal and  external ear normal. There is no impacted cerumen.     Nose: Nose normal. No congestion or rhinorrhea.     Mouth/Throat:     Mouth: Mucous membranes are moist.     Pharynx: Oropharynx is clear. No oropharyngeal exudate or posterior oropharyngeal erythema.  Eyes:     General:        Right eye: No discharge.        Left eye: No discharge.     Extraocular Movements: Extraocular movements intact.     Conjunctiva/sclera: Conjunctivae normal.     Pupils: Pupils are equal, round, and reactive to light.  Neck:     Vascular: No carotid bruit.     Trachea: No tracheal deviation.  Cardiovascular:     Rate and Rhythm: Normal rate and regular rhythm.     Pulses: Normal pulses.     Heart sounds: Normal heart sounds. No murmur. No friction rub. No gallop.   Pulmonary:     Effort: Pulmonary effort is normal. No respiratory distress.     Breath sounds: Normal breath sounds. No stridor. No wheezing, rhonchi or rales.  Chest:     Chest wall: No tenderness.  Abdominal:     General: Bowel sounds are normal. There is no distension.     Palpations: Abdomen is soft. There is no mass.     Tenderness: There is no abdominal tenderness. There is no right CVA tenderness, left CVA tenderness, guarding or rebound.     Hernia: No hernia is present.  Musculoskeletal:        General: No  swelling, tenderness, deformity or signs of injury. Normal range of motion.     Right lower leg: No edema.     Left lower leg: No edema.  Lymphadenopathy:     Cervical: No cervical adenopathy.  Skin:    General: Skin is warm and dry.     Capillary Refill: Capillary refill takes less than 2 seconds.     Coloration: Skin is not jaundiced or pale.     Findings: No bruising, erythema, lesion or rash.  Neurological:     General: No focal deficit present.     Mental Status: He is alert and oriented to person, place, and time.     Cranial Nerves: No cranial nerve deficit.     Sensory: No sensory deficit.     Motor: No weakness.     Coordination: Coordination normal.     Gait: Gait normal.     Deep Tendon Reflexes: Reflexes normal.  Psychiatric:        Mood and Affect: Mood normal.        Behavior: Behavior normal.        Thought Content: Thought content normal.        Judgment: Judgment normal.       Assessment & Plan:  1. Routine general medical examination at a health care facility - Follow up in one year  - encouraged weight loss throughout diet and exercise  - CBC with Differential/Platelet - Comprehensive metabolic panel - Lipid panel - PSA - TSH - Hemoglobin A1c  2. Mixed hyperlipidemia - Continue with statin  - CBC with Differential/Platelet - Comprehensive metabolic panel - Lipid panel - PSA - TSH - Hemoglobin A1c  3. Essential hypertension - Well controlled. No change in medications  - CBC with Differential/Platelet - Comprehensive metabolic panel - Lipid panel - PSA - TSH - Hemoglobin A1c  4. Mild intermittent asthma without complication - Continue with Advair  and albuterol   5. Gastroesophageal reflux disease, unspecified whether esophagitis present - Continue with Prilosec   6. Vasovagal syncope  - CBC with Differential/Platelet - Comprehensive metabolic panel - Lipid panel - TSH  7. Prostate cancer screening  - PSA  Shirline Frees,  NP  .

## 2019-05-21 NOTE — Patient Instructions (Signed)
It was great seeing you today   We will follow up with you regarding your blood work   Health Maintenance, Male A healthy lifestyle and preventative care can promote health and wellness.  Maintain regular health, dental, and eye exams.  Eat a healthy diet. Foods like vegetables, fruits, whole grains, low-fat dairy products, and lean protein foods contain the nutrients you need and are low in calories. Decrease your intake of foods high in solid fats, added sugars, and salt. Get information about a proper diet from your health care provider, if necessary.  Regular physical exercise is one of the most important things you can do for your health. Most adults should get at least 150 minutes of moderate-intensity exercise (any activity that increases your heart rate and causes you to sweat) each week. In addition, most adults need muscle-strengthening exercises on 2 or more days a week.   Maintain a healthy weight. The body mass index (BMI) is a screening tool to identify possible weight problems. It provides an estimate of body fat based on height and weight. Your health care provider can find your BMI and can help you achieve or maintain a healthy weight. For males 20 years and older:  A BMI below 18.5 is considered underweight.  A BMI of 18.5 to 24.9 is normal.  A BMI of 25 to 29.9 is considered overweight.  A BMI of 30 and above is considered obese.  Maintain normal blood lipids and cholesterol by exercising and minimizing your intake of saturated fat. Eat a balanced diet with plenty of fruits and vegetables. Blood tests for lipids and cholesterol should begin at age 20 and be repeated every 5 years. If your lipid or cholesterol levels are high, you are over age 50, or you are at high risk for heart disease, you may need your cholesterol levels checked more frequently.Ongoing high lipid and cholesterol levels should be treated with medicines if diet and exercise are not working.  If you  smoke, find out from your health care provider how to quit. If you do not use tobacco, do not start.  Lung cancer screening is recommended for adults aged 55-80 years who are at high risk for developing lung cancer because of a history of smoking. A yearly low-dose CT scan of the lungs is recommended for people who have at least a 30-pack-year history of smoking and are current smokers or have quit within the past 15 years. A pack year of smoking is smoking an average of 1 pack of cigarettes a day for 1 year (for example, a 30-pack-year history of smoking could mean smoking 1 pack a day for 30 years or 2 packs a day for 15 years). Yearly screening should continue until the smoker has stopped smoking for at least 15 years. Yearly screening should be stopped for people who develop a health problem that would prevent them from having lung cancer treatment.  If you choose to drink alcohol, do not have more than 2 drinks per day. One drink is considered to be 12 oz (360 mL) of beer, 5 oz (150 mL) of wine, or 1.5 oz (45 mL) of liquor.  Avoid the use of street drugs. Do not share needles with anyone. Ask for help if you need support or instructions about stopping the use of drugs.  High blood pressure causes heart disease and increases the risk of stroke. High blood pressure is more likely to develop in:  People who have blood pressure in the end of   the normal range (100-139/85-89 mm Hg).  People who are overweight or obese.  People who are African American.  If you are 18-39 years of age, have your blood pressure checked every 3-5 years. If you are 40 years of age or older, have your blood pressure checked every year. You should have your blood pressure measured twice--once when you are at a hospital or clinic, and once when you are not at a hospital or clinic. Record the average of the two measurements. To check your blood pressure when you are not at a hospital or clinic, you can use:  An automated  blood pressure machine at a pharmacy.  A home blood pressure monitor.  If you are 45-79 years old, ask your health care provider if you should take aspirin to prevent heart disease.  Diabetes screening involves taking a blood sample to check your fasting blood sugar level. This should be done once every 3 years after age 45 if you are at a normal weight and without risk factors for diabetes. Testing should be considered at a younger age or be carried out more frequently if you are overweight and have at least 1 risk factor for diabetes.  Colorectal cancer can be detected and often prevented. Most routine colorectal cancer screening begins at the age of 50 and continues through age 75. However, your health care provider may recommend screening at an earlier age if you have risk factors for colon cancer. On a yearly basis, your health care provider may provide home test kits to check for hidden blood in the stool. A small camera at the end of a tube may be used to directly examine the colon (sigmoidoscopy or colonoscopy) to detect the earliest forms of colorectal cancer. Talk to your health care provider about this at age 50 when routine screening begins. A direct exam of the colon should be repeated every 5-10 years through age 75, unless early forms of precancerous polyps or small growths are found.  People who are at an increased risk for hepatitis B should be screened for this virus. You are considered at high risk for hepatitis B if:  You were born in a country where hepatitis B occurs often. Talk with your health care provider about which countries are considered high risk.  Your parents were born in a high-risk country and you have not received a shot to protect against hepatitis B (hepatitis B vaccine).  You have HIV or AIDS.  You use needles to inject street drugs.  You live with, or have sex with, someone who has hepatitis B.  You are a man who has sex with other men (MSM).  You get  hemodialysis treatment.  You take certain medicines for conditions like cancer, organ transplantation, and autoimmune conditions.  Hepatitis C blood testing is recommended for all people born from 1945 through 1965 and any individual with known risk factors for hepatitis C.  Healthy men should no longer receive prostate-specific antigen (PSA) blood tests as part of routine cancer screening. Talk to your health care provider about prostate cancer screening.  Testicular cancer screening is not recommended for adolescents or adult males who have no symptoms. Screening includes self-exam, a health care provider exam, and other screening tests. Consult with your health care provider about any symptoms you have or any concerns you have about testicular cancer.  Practice safe sex. Use condoms and avoid high-risk sexual practices to reduce the spread of sexually transmitted infections (STIs).  You should be screened   for STIs, including gonorrhea and chlamydia if:  You are sexually active and are younger than 24 years.  You are older than 24 years, and your health care provider tells you that you are at risk for this type of infection.  Your sexual activity has changed since you were last screened, and you are at an increased risk for chlamydia or gonorrhea. Ask your health care provider if you are at risk.  If you are at risk of being infected with HIV, it is recommended that you take a prescription medicine daily to prevent HIV infection. This is called pre-exposure prophylaxis (PrEP). You are considered at risk if:  You are a man who has sex with other men (MSM).  You are a heterosexual man who is sexually active with multiple partners.  You take drugs by injection.  You are sexually active with a partner who has HIV.  Talk with your health care provider about whether you are at high risk of being infected with HIV. If you choose to begin PrEP, you should first be tested for HIV. You should  then be tested every 3 months for as long as you are taking PrEP.  Use sunscreen. Apply sunscreen liberally and repeatedly throughout the day. You should seek shade when your shadow is shorter than you. Protect yourself by wearing long sleeves, pants, a wide-brimmed hat, and sunglasses year round whenever you are outdoors.  Tell your health care provider of new moles or changes in moles, especially if there is a change in shape or color. Also, tell your health care provider if a mole is larger than the size of a pencil eraser.  A one-time screening for abdominal aortic aneurysm (AAA) and surgical repair of large AAAs by ultrasound is recommended for men aged 65-75 years who are current or former smokers.  Stay current with your vaccines (immunizations).   This information is not intended to replace advice given to you by your health care provider. Make sure you discuss any questions you have with your health care provider.   Document Released: 07/07/2007 Document Revised: 01/29/2014 Document Reviewed: 06/05/2010 Elsevier Interactive Patient Education 2016 [employer] Inc[employer]   

## 2019-09-22 ENCOUNTER — Telehealth: Payer: Self-pay | Admitting: Adult Health

## 2019-09-22 DIAGNOSIS — F988 Other specified behavioral and emotional disorders with onset usually occurring in childhood and adolescence: Secondary | ICD-10-CM

## 2019-09-22 MED ORDER — AMPHETAMINE-DEXTROAMPHETAMINE 15 MG PO TABS: 15.0000 mg | ORAL_TABLET | Freq: Every day | ORAL | 0 refills | Status: DC

## 2019-09-22 MED ORDER — ONDANSETRON HCL 4 MG PO TABS: 4.0000 mg | ORAL_TABLET | Freq: Three times a day (TID) | ORAL | 1 refills | Status: DC | PRN

## 2019-09-22 NOTE — Telephone Encounter (Signed)
Patient has upcoming for vacation and needs Adderall as well as Zofran refilled

## 2019-11-27 IMAGING — CT CT CHEST WITHOUT CONTRAST
1 series · 16 of 33 positions shown, 20 images · non-contrast
Comparison: None.

CLINICAL DATA: Shortness of breath, chest pain, low-grade fever

EXAM:
CT CHEST WITHOUT CONTRAST
TECHNIQUE: Multidetector CT imaging of the chest was performed following the
standard protocol without IV contrast.

[Series 2: chest w/(date) · axial · 0.92mm/px · z∈[-399,-47]mm · 16 of 192 slices shown, 20 images]
[im 8/192  mediastinal]
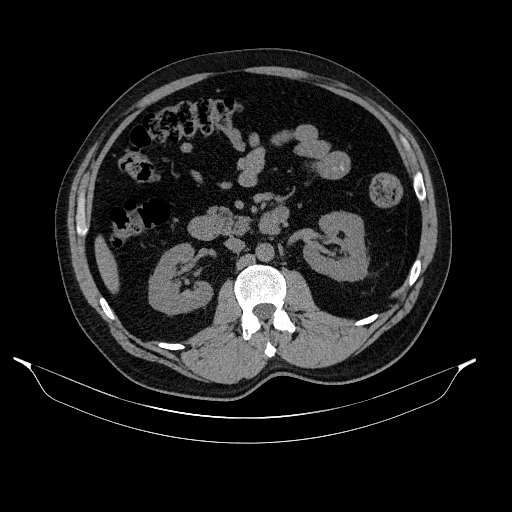
[im 8/192  lung]
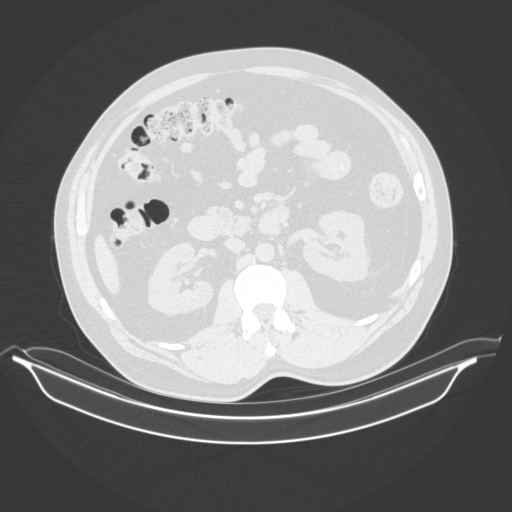
[im 22/192  lung]
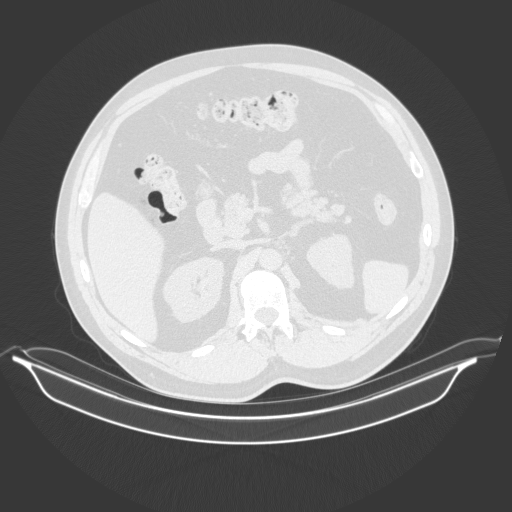
[im 36/192  lung]
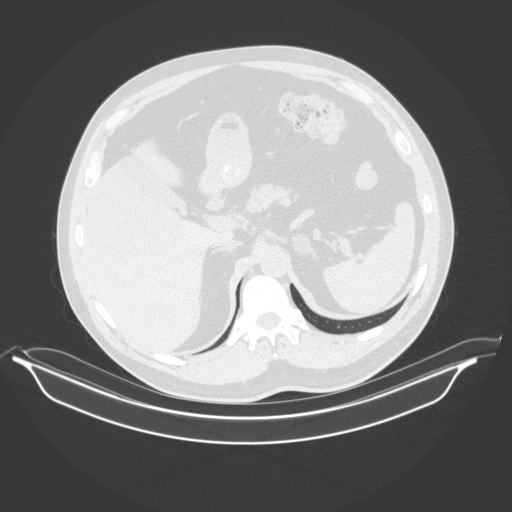
[im 43/192  lung]
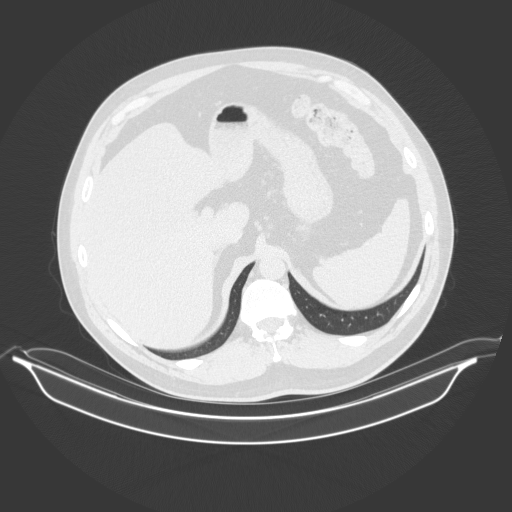
[im 57/192  mediastinal]
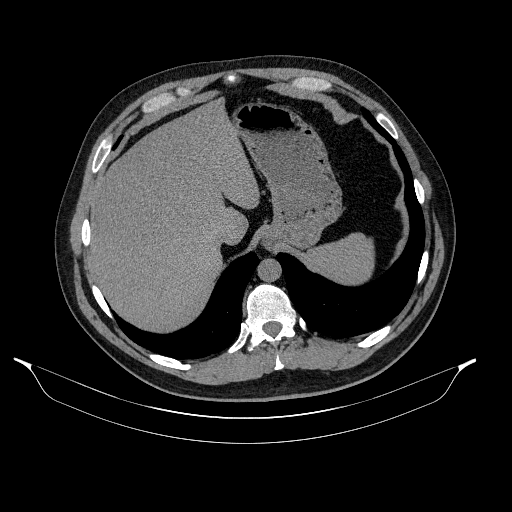
[im 57/192  lung]
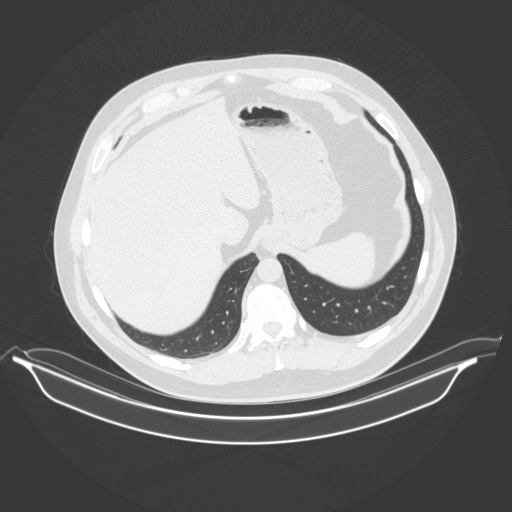
[im 71/192  lung]
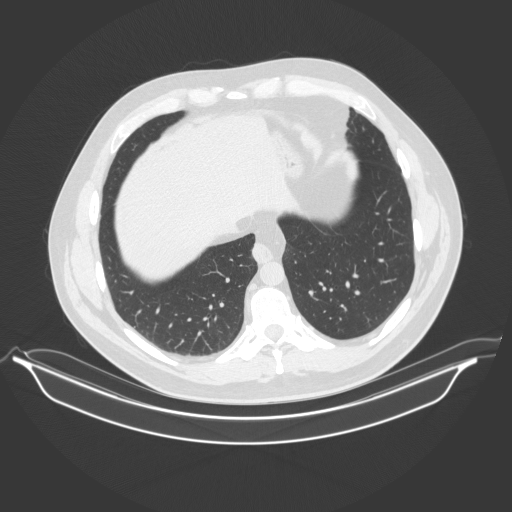
[im 78/192  lung]
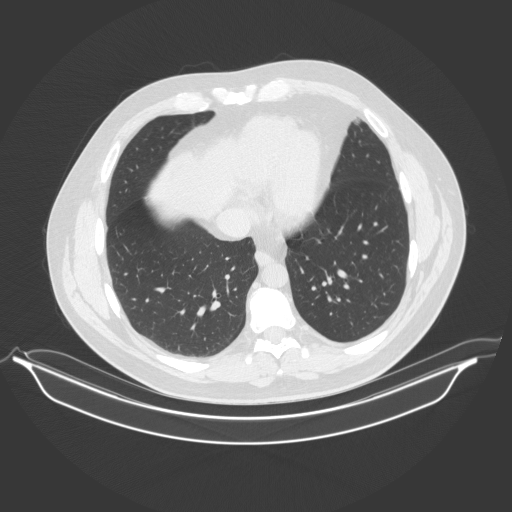
[im 92/192  lung]
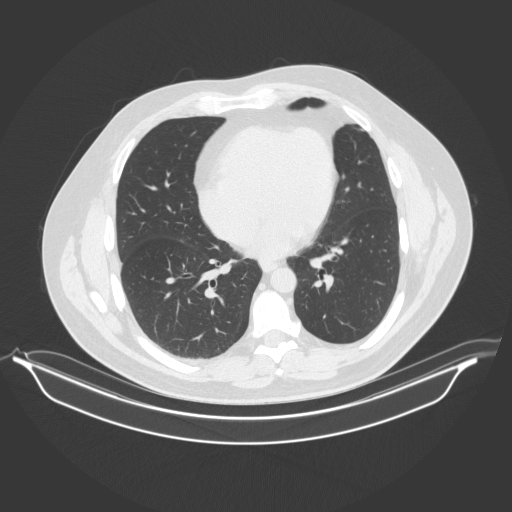
[im 101/192  mediastinal]
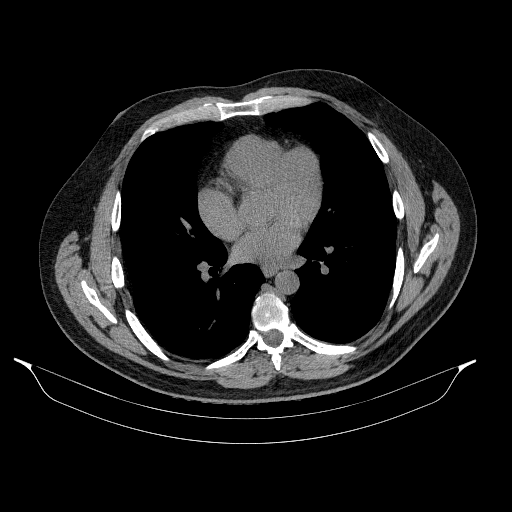
[im 101/192  lung]
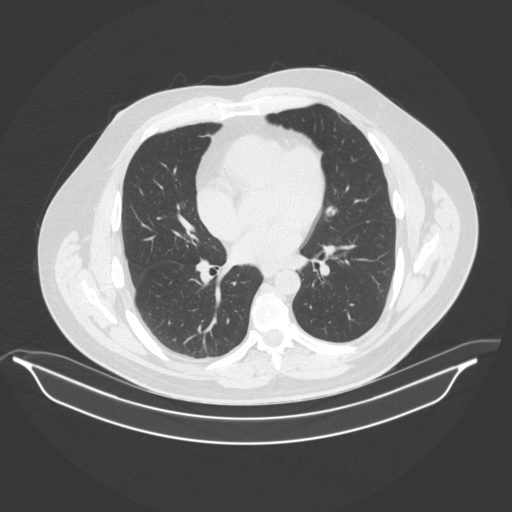
[im 114/192  lung]
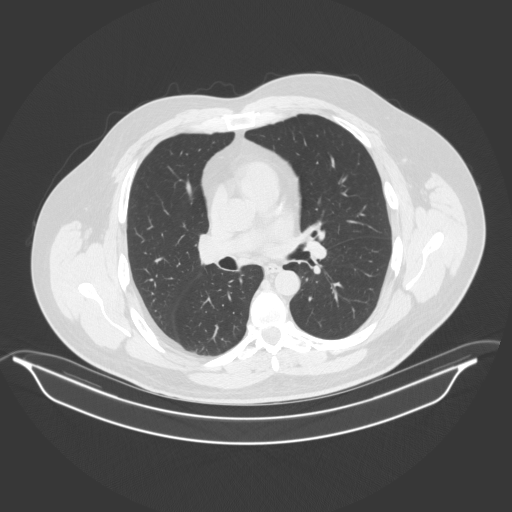
[im 121/192  lung]
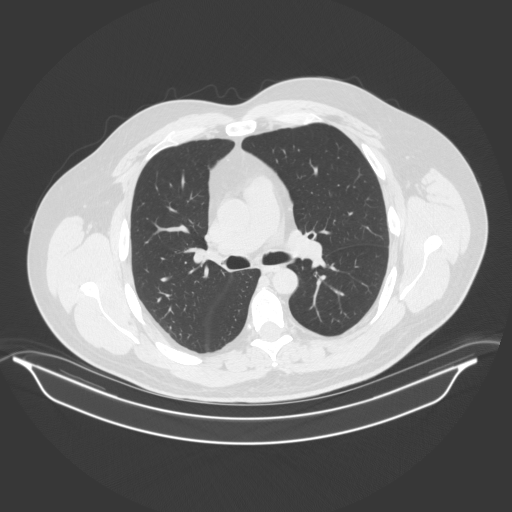
[im 135/192  lung]
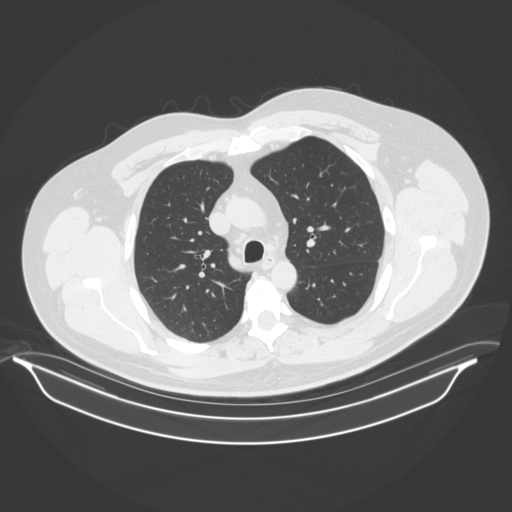
[im 149/192  mediastinal]
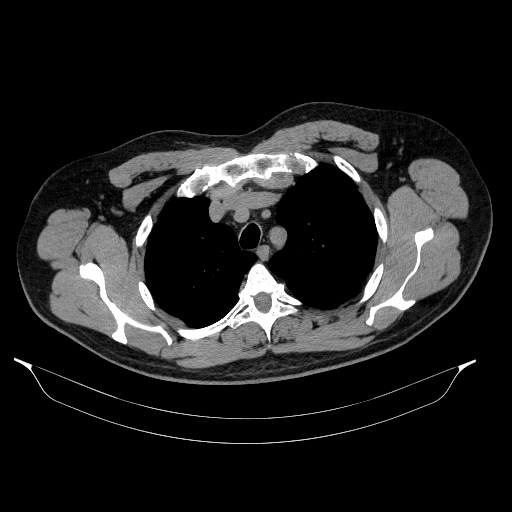
[im 149/192  lung]
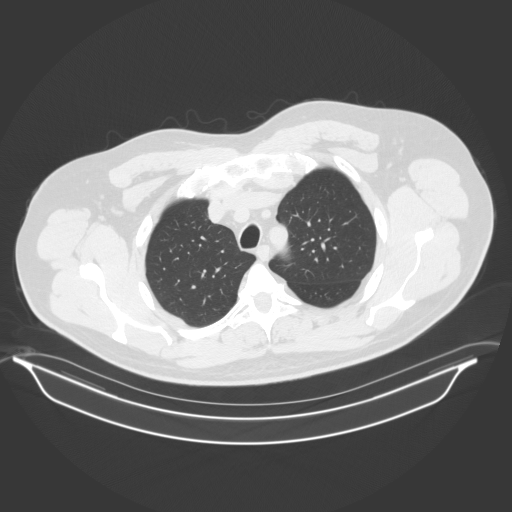
[im 156/192  lung]
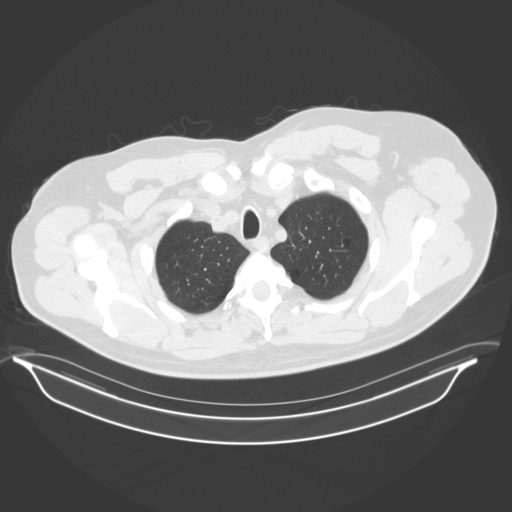
[im 170/192  lung]
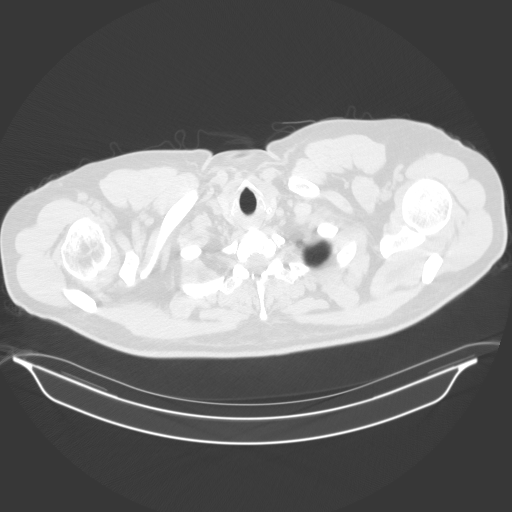
[im 184/192  lung]
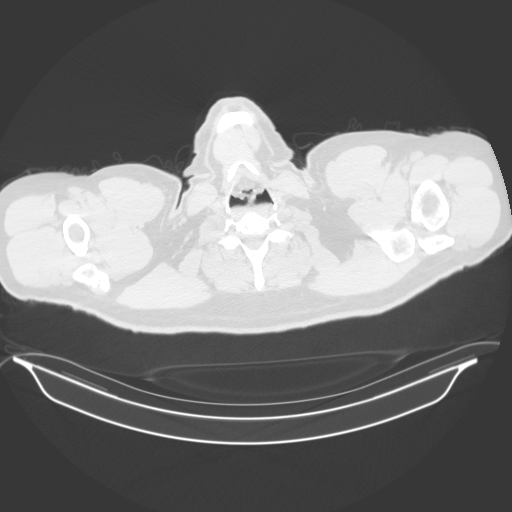

[16 of 33 positions shown; findings below may reference images not displayed]

FINDINGS: Cardiovascular: Heart is normal size. Aorta is normal caliber.
Scattered coronary artery calcifications and aortic calcifications.

Mediastinum/Nodes: No mediastinal, hilar, or axillary adenopathy.

Lungs/Pleura: Lungs are clear. No focal airspace opacities or
suspicious nodules. No effusions.

Upper Abdomen: Imaging into the upper abdomen shows no acute
findings. Low-density nodule in the left adrenal gland compatible
with adenoma measuring 2.5 cm.

Musculoskeletal: Chest wall soft tissues are unremarkable. No acute
bony abnormality.
IMPRESSION: No acute cardiopulmonary disease.

Scattered coronary artery and aortic calcifications.

## 2019-12-04 ENCOUNTER — Other Ambulatory Visit: Payer: Self-pay | Admitting: Adult Health

## 2019-12-04 MED ORDER — CLINDAMYCIN HCL 150 MG PO CAPS: 450.0000 mg | ORAL_CAPSULE | Freq: Three times a day (TID) | ORAL | 0 refills | Status: AC

## 2019-12-31 ENCOUNTER — Other Ambulatory Visit: Payer: Self-pay | Admitting: Adult Health

## 2019-12-31 MED ORDER — DOXYCYCLINE HYCLATE 100 MG PO CAPS: 100.0000 mg | ORAL_CAPSULE | Freq: Two times a day (BID) | ORAL | 0 refills | Status: DC

## 2019-12-31 MED ORDER — PREDNISONE 10 MG PO TABS: ORAL_TABLET | ORAL | 0 refills | Status: DC

## 2020-02-15 ENCOUNTER — Other Ambulatory Visit: Payer: Self-pay | Admitting: Adult Health

## 2020-02-15 MED ORDER — CLINDAMYCIN HCL 300 MG PO CAPS: 300.0000 mg | ORAL_CAPSULE | Freq: Three times a day (TID) | ORAL | 0 refills | Status: AC

## 2020-03-29 ENCOUNTER — Other Ambulatory Visit: Payer: Self-pay | Admitting: Adult Health

## 2020-04-06 ENCOUNTER — Other Ambulatory Visit: Payer: Self-pay | Admitting: Adult Health

## 2020-04-06 MED ORDER — WEGOVY 0.25 MG/0.5ML ~~LOC~~ SOAJ: 0.2500 mg | SUBCUTANEOUS | 0 refills | Status: DC

## 2020-04-06 NOTE — Progress Notes (Signed)
Spoke to patient. He would like help with weight loss therapy as his weight continues to climb. Will prescribe Wegovy as this will be the safest for him

## 2020-05-11 ENCOUNTER — Other Ambulatory Visit: Payer: Self-pay | Admitting: Adult Health

## 2020-05-13 NOTE — Telephone Encounter (Signed)
Last OV:  05/21/2019 Last Refill: 04/06/2020 Future OV: None scheduled

## 2020-05-17 ENCOUNTER — Other Ambulatory Visit: Payer: Self-pay | Admitting: Adult Health

## 2020-05-17 MED ORDER — WEGOVY 1.7 MG/0.75ML ~~LOC~~ SOAJ: 1.7000 mg | SUBCUTANEOUS | 0 refills | Status: DC

## 2020-05-17 MED ORDER — WEGOVY 0.5 MG/0.5ML ~~LOC~~ SOAJ: 0.5000 mg | SUBCUTANEOUS | 0 refills | Status: DC

## 2020-06-01 ENCOUNTER — Other Ambulatory Visit: Payer: Self-pay | Admitting: Adult Health

## 2020-06-01 MED ORDER — SCOPOLAMINE 1 MG/3DAYS TD PT72: 1.0000 | MEDICATED_PATCH | TRANSDERMAL | 0 refills | Status: DC

## 2020-07-21 ENCOUNTER — Other Ambulatory Visit: Payer: Self-pay | Admitting: Adult Health

## 2020-07-21 DIAGNOSIS — E782 Mixed hyperlipidemia: Secondary | ICD-10-CM

## 2020-07-21 DIAGNOSIS — F988 Other specified behavioral and emotional disorders with onset usually occurring in childhood and adolescence: Secondary | ICD-10-CM

## 2020-07-21 MED ORDER — AMPHETAMINE-DEXTROAMPHETAMINE 15 MG PO TABS: 15.0000 mg | ORAL_TABLET | Freq: Every day | ORAL | 0 refills | Status: DC

## 2020-07-21 MED ORDER — LISINOPRIL 10 MG PO TABS: 10.0000 mg | ORAL_TABLET | Freq: Every day | ORAL | 3 refills | Status: DC

## 2020-07-21 MED ORDER — LOVASTATIN 20 MG PO TABS: 20.0000 mg | ORAL_TABLET | Freq: Every day | ORAL | 3 refills | Status: DC

## 2020-08-15 ENCOUNTER — Other Ambulatory Visit: Payer: Self-pay

## 2020-08-16 ENCOUNTER — Ambulatory Visit: Payer: 59 | Admitting: Adult Health

## 2020-08-16 ENCOUNTER — Encounter: Payer: Self-pay | Admitting: Adult Health

## 2020-08-16 VITALS — BP 110/90 | HR 78 | Temp 98.4°F | Ht 71.0 in | Wt 202.0 lb

## 2020-08-16 DIAGNOSIS — I10 Essential (primary) hypertension: Secondary | ICD-10-CM

## 2020-08-16 DIAGNOSIS — Z1211 Encounter for screening for malignant neoplasm of colon: Secondary | ICD-10-CM

## 2020-08-16 DIAGNOSIS — Z111 Encounter for screening for respiratory tuberculosis: Secondary | ICD-10-CM

## 2020-08-16 DIAGNOSIS — E782 Mixed hyperlipidemia: Secondary | ICD-10-CM

## 2020-08-16 DIAGNOSIS — F988 Other specified behavioral and emotional disorders with onset usually occurring in childhood and adolescence: Secondary | ICD-10-CM

## 2020-08-16 DIAGNOSIS — Z0000 Encounter for general adult medical examination without abnormal findings: Secondary | ICD-10-CM

## 2020-08-16 DIAGNOSIS — Z114 Encounter for screening for human immunodeficiency virus [HIV]: Secondary | ICD-10-CM

## 2020-08-16 DIAGNOSIS — Z9229 Personal history of other drug therapy: Secondary | ICD-10-CM

## 2020-08-16 DIAGNOSIS — J4520 Mild intermittent asthma, uncomplicated: Secondary | ICD-10-CM

## 2020-08-16 DIAGNOSIS — K219 Gastro-esophageal reflux disease without esophagitis: Secondary | ICD-10-CM

## 2020-08-16 DIAGNOSIS — Z1159 Encounter for screening for other viral diseases: Secondary | ICD-10-CM

## 2020-08-16 DIAGNOSIS — Z125 Encounter for screening for malignant neoplasm of prostate: Secondary | ICD-10-CM

## 2020-08-16 NOTE — Patient Instructions (Signed)
Health Maintenance Due  Topic Date Due   Hepatitis C Screening  Never done   COLONOSCOPY (Pts 45-12yrs Insurance coverage will need to be confirmed)  Never done   Zoster Vaccines- Shingrix (1 of 2) Never done   COVID-19 Vaccine (4 - Booster for Pfizer series) 03/24/2020    Depression screen PHQ 2/9 08/16/2020  Decreased Interest 0  Down, Depressed, Hopeless 0  PHQ - 2 Score 0

## 2020-08-16 NOTE — Progress Notes (Signed)
Subjective:    Patient ID: Johnny Michael, male    DOB: 04-Mar-1970, 50 y.o.   MRN: [id number]  HPI  Patient presents for yearly preventative medicine examination. He is a pleasant 50 year old male who  has a past medical history of ADD (attention deficit disorder), Asthma, Essential hypertension, GERD (gastroesophageal reflux disease), and Hyperlipidemia.  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  Essential Hypertension -prescribed lisinopril 10 mg and Corgard 10 mg daily.  He denies dizziness, lightheadedness, chest pain, shortness of breath BP Readings from Last 3 Encounters:  08/16/20 110/90  05/21/19 110/70  05/08/17 100/70   GERD - takes Prilosec 20 mg daily.   Hyperlipidemia - controlled with lovastatin 20 mg daily. He denies myalgia or fatigue   Weight loss management -was prescribed Wegovy, but had to stop taking this due to national shortage.  He did well with the medication while taking it but had to stop due to [employer]. Wt Readings from Last 3 Encounters:  08/16/20 202 lb (91.6 kg)  05/21/19 220 lb (99.8 kg)  05/08/17 215 lb (97.5 kg)   Asthma - uses Advair BID and Albuterol inhaler PRN. Denies recent exacerbations.   All immunizations and health maintenance protocols were reviewed with the patient and needed orders were placed.  Appropriate screening laboratory values were ordered for the patient including screening of hyperlipidemia, renal function and hepatic function. If indicated by BPH, a PSA was ordered.  Medication reconciliation,  past medical history, social history, problem list and allergies were reviewed in detail with the patient  Goals were established with regard to weight loss, exercise, and  diet in compliance with medications    Review of Systems  Constitutional: Negative.   HENT: Negative.    Eyes: Negative.   Respiratory: Negative.     Cardiovascular: Negative.   Gastrointestinal: Negative.   Endocrine: Negative.   Genitourinary: Negative.   Musculoskeletal: Negative.   Skin: Negative.   Allergic/Immunologic: Negative.   Neurological: Negative.   Hematological: Negative.   Psychiatric/Behavioral: Negative.    All other systems reviewed and are negative.  Past Medical History:  Diagnosis Date   ADD (attention deficit disorder)    Asthma    Essential hypertension    GERD (gastroesophageal reflux disease)    Hyperlipidemia     Social History   Socioeconomic History   Marital status: Single    Spouse name: Not on file   Number of children: Not on file   Years of education: Not on file   Highest education level: Not on file  Occupational History   Occupation: Nurse    Employer: [employer]  Tobacco Use   Smoking status: Never   Smokeless tobacco: Never  Substance and Sexual Activity   Alcohol use: Not on file    Comment: socially    Drug use: Never   Sexual activity: Yes    Partners: Female  Other Topics Concern   Not on file  Social History Narrative   Not on file   Social Determinants of Health   Financial Resource Strain: Not on file  Food Insecurity: Not on file  Transportation Needs: Not on file  Physical Activity: Not on file  Stress: Not on file  Social Connections: Not on file  Intimate Partner Violence: Not on file    Past Surgical History:  Procedure Laterality Date   VASECTOMY  Family History  Problem Relation Age of Onset   Hypertension Mother    High Cholesterol Mother    High blood pressure Father    Hyperlipidemia Father    Early death Brother        mva    Allergies  Allergen Reactions   Amoxicillin Anaphylaxis    Current Outpatient Medications on File Prior to Visit  Medication Sig Dispense Refill   albuterol (PROVENTIL) (2.5 MG/3ML) 0.083% nebulizer solution Take 3 mLs (2.5 mg total) by nebulization every 6 (six) hours as needed for wheezing or  shortness of breath. 150 mL 1   albuterol (VENTOLIN HFA) 108 (90 Base) MCG/ACT inhaler Inhale 2 puffs into the lungs every 6 (six) hours as needed for wheezing or shortness of breath. 6.7 g 3   amphetamine-dextroamphetamine (ADDERALL) 15 MG tablet Take 1 tablet by mouth daily. 30 tablet 0   amphetamine-dextroamphetamine (ADDERALL) 15 MG tablet Take 1 tablet by mouth daily. 30 tablet 0   amphetamine-dextroamphetamine (ADDERALL) 15 MG tablet Take 1 tablet by mouth daily. 30 tablet 0   lisinopril (ZESTRIL) 10 MG tablet Take 1 tablet (10 mg total) by mouth daily. 90 tablet 3   lovastatin (MEVACOR) 20 MG tablet Take 1 tablet (20 mg total) by mouth at bedtime. 90 tablet 3   nadolol (CORGARD) 20 MG tablet TAKE 1/2 TABLET(10 MG) BY MOUTH DAILY 45 tablet 3   ondansetron (ZOFRAN) 4 MG tablet Take 1 tablet (4 mg total) by mouth every 8 (eight) hours as needed for nausea or vomiting. 20 tablet 1   Semaglutide-Weight Management (WEGOVY) 1.7 MG/0.75ML SOAJ Inject 1.7 mg into the skin once a week. 3 mL 0   Fluticasone-Salmeterol (ADVAIR DISKUS) 250-50 MCG/DOSE AEPB Inhale 1 puff into the lungs 2 (two) times daily. 60 each 11   omeprazole (PRILOSEC) 20 MG capsule Take 1 capsule (20 mg total) by mouth daily. 90 capsule 3   No current facility-administered medications on file prior to visit.    BP 110/90   Pulse 78   Temp 98.4 F (36.9 C) (Oral)   Ht $R'5\' 11"'xb$  (1.803 m)   Wt 202 lb (91.6 kg)   SpO2 98%   BMI 28.17 kg/m        Objective:   Physical Exam Vitals and nursing note reviewed.  Constitutional:      General: He is not in acute distress.    Appearance: Normal appearance. He is well-developed and normal weight.  HENT:     Head: Normocephalic and atraumatic.     Right Ear: Tympanic membrane, ear canal and external ear normal. There is no impacted cerumen.     Left Ear: Tympanic membrane, ear canal and external ear normal. There is no impacted cerumen.     Nose: Nose normal. No congestion or  rhinorrhea.     Mouth/Throat:     Mouth: Mucous membranes are moist.     Pharynx: Oropharynx is clear. No oropharyngeal exudate or posterior oropharyngeal erythema.  Eyes:     General:        Right eye: No discharge.        Left eye: No discharge.     Extraocular Movements: Extraocular movements intact.     Conjunctiva/sclera: Conjunctivae normal.     Pupils: Pupils are equal, round, and reactive to light.  Neck:     Vascular: No carotid bruit.     Trachea: No tracheal deviation.  Cardiovascular:     Rate and Rhythm: Normal rate and regular rhythm.  Pulses: Normal pulses.     Heart sounds: Normal heart sounds. No murmur heard.   No friction rub. No gallop.  Pulmonary:     Effort: Pulmonary effort is normal. No respiratory distress.     Breath sounds: Normal breath sounds. No stridor. No wheezing, rhonchi or rales.  Chest:     Chest wall: No tenderness.  Abdominal:     General: Bowel sounds are normal. There is no distension.     Palpations: Abdomen is soft. There is no mass.     Tenderness: There is no abdominal tenderness. There is no right CVA tenderness, left CVA tenderness, guarding or rebound.     Hernia: No hernia is present.  Musculoskeletal:        General: No swelling, tenderness, deformity or signs of injury. Normal range of motion.     Right lower leg: No edema.     Left lower leg: No edema.  Lymphadenopathy:     Cervical: No cervical adenopathy.  Skin:    General: Skin is warm and dry.     Capillary Refill: Capillary refill takes less than 2 seconds.     Coloration: Skin is not jaundiced or pale.     Findings: No bruising, erythema, lesion or rash.  Neurological:     General: No focal deficit present.     Mental Status: He is alert and oriented to person, place, and time.     Cranial Nerves: No cranial nerve deficit.     Sensory: No sensory deficit.     Motor: No weakness.     Coordination: Coordination normal.     Gait: Gait normal.     Deep Tendon  Reflexes: Reflexes normal.  Psychiatric:        Mood and Affect: Mood normal.        Behavior: Behavior normal.        Thought Content: Thought content normal.        Judgment: Judgment normal.      Assessment & Plan:  1. Routine general medical examination at a health care facility - Continue with weight loss measures.  - Follow up in one year or sooner if needed - CBC with Differential/Platelet; Future - Comprehensive metabolic panel; Future - Lipid panel; Future - TSH; Future  2. Attention deficit disorder, unspecified hyperactivity presence - Continue adderall   3. Mixed hyperlipidemia - Consider increase in statin  - CBC with Differential/Platelet; Future - Comprehensive metabolic panel; Future - Lipid panel; Future - TSH; Future  4. Essential hypertension - Well controlled. No change in medications  - CBC with Differential/Platelet; Future - Comprehensive metabolic panel; Future - Lipid panel; Future - TSH; Future  5. Mild intermittent asthma without complication - Continue with inhalers   6. Gastroesophageal reflux disease, unspecified whether esophagitis present - Continue with Prilosec  7. Prostate cancer screening  - PSA; Future  8. Colon cancer screening  - Cologuard  9. Need for hepatitis C screening test  - Hep C Antibody; Future  10. Encounter for screening for HIV  - HIV Antibody (routine testing w rflx); Future  11. Screening-pulmonary TB  - QuantiFERON-TB Gold Plus; Future  12. History of MMR vaccination  - Measles/Mumps/Rubella Immunity; Future  13. Screening for colon cancer  - Cologuard  Dorothyann Peng, NP

## 2020-08-17 ENCOUNTER — Other Ambulatory Visit: Payer: Self-pay | Admitting: Adult Health

## 2020-08-19 LAB — COMPREHENSIVE METABOLIC PANEL
ALT: 23 IU/L (ref 0–44)
AST: 13 IU/L (ref 0–40)
Albumin/Globulin Ratio: 1.7 (ref 1.2–2.2)
Albumin: 4.6 g/dL (ref 4.0–5.0)
Alkaline Phosphatase: 79 IU/L (ref 44–121)
BUN/Creatinine Ratio: 15 (ref 9–20)
BUN: 13 mg/dL (ref 6–24)
Bilirubin Total: 0.4 mg/dL (ref 0.0–1.2)
CO2: 24 mmol/L (ref 20–29)
Calcium: 9.7 mg/dL (ref 8.7–10.2)
Chloride: 105 mmol/L (ref 96–106)
Creatinine, Ser: 0.88 mg/dL (ref 0.76–1.27)
Globulin, Total: 2.7 g/dL (ref 1.5–4.5)
Glucose: 90 mg/dL (ref 65–99)
Potassium: 4.6 mmol/L (ref 3.5–5.2)
Sodium: 142 mmol/L (ref 134–144)
Total Protein: 7.3 g/dL (ref 6.0–8.5)
eGFR: 105 mL/min/{1.73_m2} (ref >59)

## 2020-08-19 LAB — CBC WITH DIFFERENTIAL/PLATELET
Basophils Absolute: 0.1 10*3/uL (ref 0.0–0.2)
Basos: 1 %
EOS (ABSOLUTE): 0.2 10*3/uL (ref 0.0–0.4)
Eos: 3 %
Hematocrit: 46.6 % (ref 37.5–51.0)
Hemoglobin: 15.7 g/dL (ref 13.0–17.7)
Immature Grans (Abs): 0 10*3/uL (ref 0.0–0.1)
Immature Granulocytes: 0 %
Lymphocytes Absolute: 2 10*3/uL (ref 0.7–3.1)
Lymphs: 26 %
MCH: 31 pg (ref 26.6–33.0)
MCHC: 33.7 g/dL (ref 31.5–35.7)
MCV: 92 fL (ref 79–97)
Monocytes Absolute: 0.5 10*3/uL (ref 0.1–0.9)
Monocytes: 7 %
Neutrophils Absolute: 5 10*3/uL (ref 1.4–7.0)
Neutrophils: 63 %
Platelets: 284 10*3/uL (ref 150–450)
RBC: 5.07 x10E6/uL (ref 4.14–5.80)
RDW: 12.3 % (ref 11.6–15.4)
WBC: 7.8 10*3/uL (ref 3.4–10.8)

## 2020-08-19 LAB — LIPID PANEL W/O CHOL/HDL RATIO
Cholesterol, Total: 193 mg/dL (ref 100–199)
HDL: 41 mg/dL (ref >39)
LDL Chol Calc (NIH): 138 mg/dL — ABNORMAL HIGH (ref 0–99)
Triglycerides: 78 mg/dL (ref 0–149)
VLDL Cholesterol Cal: 14 mg/dL (ref 5–40)

## 2020-08-19 LAB — HIV ANTIBODY (ROUTINE TESTING W REFLEX): HIV Screen 4th Generation wRfx: NONREACTIVE

## 2020-08-19 LAB — PSA: Prostate Specific Ag, Serum: 0.3 ng/mL (ref 0.0–4.0)

## 2020-08-19 LAB — MEASLES/MUMPS/RUBELLA IMMUNITY
MUMPS ABS, IGG: <9 AU/mL — ABNORMAL LOW (ref >10.9)
RUBEOLA AB, IGG: 170 AU/mL (ref >16.4)
Rubella Antibodies, IGG: 8.33 index (ref >0.99)

## 2020-08-19 LAB — HEPATITIS C ANTIBODY: Hep C Virus Ab: 0.1 s/co ratio (ref 0.0–0.9)

## 2020-08-19 LAB — TSH: TSH: 1.08 u[IU]/mL (ref 0.450–4.500)

## 2020-08-28 LAB — COLOGUARD: Cologuard: NEGATIVE

## 2020-10-31 ENCOUNTER — Telehealth: Payer: Self-pay | Admitting: Adult Health

## 2020-10-31 MED ORDER — PREDNISONE 10 MG PO TABS: ORAL_TABLET | ORAL | 1 refills | Status: DC

## 2020-10-31 MED ORDER — TIRZEPATIDE 2.5 MG/0.5ML ~~LOC~~ SOAJ: 2.5000 mg | SUBCUTANEOUS | 0 refills | Status: DC

## 2020-10-31 MED ORDER — DOXYCYCLINE HYCLATE 100 MG PO CAPS: 100.0000 mg | ORAL_CAPSULE | Freq: Two times a day (BID) | ORAL | 0 refills | Status: DC

## 2020-10-31 NOTE — Telephone Encounter (Signed)
Spoke to patient, he is out of town working. Reports sore throat, chest congestion, yellow productive cough. Shortness of breath. Denies fevers, chills, or enlarged tonsils with exudate.   Will send in Doxycycline and Prednisone

## 2020-11-23 ENCOUNTER — Telehealth: Payer: Self-pay | Admitting: Adult Health

## 2020-11-23 MED ORDER — DICYCLOMINE HCL 20 MG PO TABS: 20.0000 mg | ORAL_TABLET | Freq: Three times a day (TID) | ORAL | 0 refills | Status: DC

## 2020-11-23 NOTE — Telephone Encounter (Signed)
Spoke to patient on the phone. He has been experiencing severe diarrhea and abdominal cramping  since starting Mounjaro. Advised to stop mounjaro and will send in Bentyl

## 2021-01-05 ENCOUNTER — Other Ambulatory Visit: Payer: Self-pay | Admitting: Adult Health

## 2021-01-05 DIAGNOSIS — E782 Mixed hyperlipidemia: Secondary | ICD-10-CM

## 2021-01-05 MED ORDER — LOVASTATIN 20 MG PO TABS: 20.0000 mg | ORAL_TABLET | Freq: Every day | ORAL | 3 refills | Status: DC

## 2021-01-05 MED ORDER — LISINOPRIL 10 MG PO TABS: 10.0000 mg | ORAL_TABLET | Freq: Every day | ORAL | 3 refills | Status: DC

## 2021-01-05 MED ORDER — NADOLOL 20 MG PO TABS: ORAL_TABLET | ORAL | 3 refills | Status: DC

## 2021-03-29 ENCOUNTER — Other Ambulatory Visit: Payer: Self-pay | Admitting: Adult Health

## 2021-03-29 MED ORDER — ONDANSETRON HCL 4 MG PO TABS: 4.0000 mg | ORAL_TABLET | Freq: Three times a day (TID) | ORAL | 2 refills | Status: DC | PRN

## 2021-04-14 ENCOUNTER — Other Ambulatory Visit: Payer: Self-pay | Admitting: Adult Health

## 2021-04-14 MED ORDER — SUCRALFATE 1 G PO TABS: 1.0000 g | ORAL_TABLET | Freq: Three times a day (TID) | ORAL | 0 refills | Status: DC

## 2021-04-14 MED ORDER — PANTOPRAZOLE SODIUM 40 MG PO TBEC: 40.0000 mg | DELAYED_RELEASE_TABLET | Freq: Two times a day (BID) | ORAL | 1 refills | Status: DC

## 2021-07-03 ENCOUNTER — Other Ambulatory Visit: Payer: Self-pay | Admitting: Adult Health

## 2021-07-03 MED ORDER — DOXYCYCLINE HYCLATE 100 MG PO CAPS: 100.0000 mg | ORAL_CAPSULE | Freq: Two times a day (BID) | ORAL | 2 refills | Status: DC

## 2021-09-26 ENCOUNTER — Other Ambulatory Visit: Payer: Self-pay | Admitting: Adult Health

## 2021-09-26 DIAGNOSIS — J4520 Mild intermittent asthma, uncomplicated: Secondary | ICD-10-CM

## 2021-09-26 MED ORDER — ALBUTEROL SULFATE HFA 108 (90 BASE) MCG/ACT IN AERS: 2.0000 | INHALATION_SPRAY | Freq: Four times a day (QID) | RESPIRATORY_TRACT | 3 refills | Status: DC | PRN

## 2021-09-26 MED ORDER — PREDNISONE 10 MG PO TABS: ORAL_TABLET | ORAL | 0 refills | Status: DC

## 2021-10-06 ENCOUNTER — Other Ambulatory Visit: Payer: Self-pay | Admitting: Adult Health

## 2021-10-06 DIAGNOSIS — J189 Pneumonia, unspecified organism: Secondary | ICD-10-CM

## 2021-10-06 MED ORDER — FLUTICASONE-SALMETEROL 250-50 MCG/ACT IN AEPB: 1.0000 | INHALATION_SPRAY | Freq: Two times a day (BID) | RESPIRATORY_TRACT | 6 refills | Status: DC

## 2021-10-06 MED ORDER — ALBUTEROL SULFATE (2.5 MG/3ML) 0.083% IN NEBU: 2.5000 mg | INHALATION_SOLUTION | Freq: Four times a day (QID) | RESPIRATORY_TRACT | 1 refills | Status: DC | PRN

## 2021-10-06 MED ORDER — DOXYCYCLINE HYCLATE 100 MG PO CAPS: 100.0000 mg | ORAL_CAPSULE | Freq: Two times a day (BID) | ORAL | 0 refills | Status: DC

## 2021-10-16 ENCOUNTER — Other Ambulatory Visit: Payer: Self-pay | Admitting: Adult Health

## 2021-12-24 ENCOUNTER — Other Ambulatory Visit: Payer: Self-pay | Admitting: Adult Health

## 2021-12-24 DIAGNOSIS — E782 Mixed hyperlipidemia: Secondary | ICD-10-CM

## 2021-12-24 DIAGNOSIS — J189 Pneumonia, unspecified organism: Secondary | ICD-10-CM

## 2021-12-24 DIAGNOSIS — J4520 Mild intermittent asthma, uncomplicated: Secondary | ICD-10-CM

## 2021-12-24 DIAGNOSIS — F988 Other specified behavioral and emotional disorders with onset usually occurring in childhood and adolescence: Secondary | ICD-10-CM

## 2021-12-24 MED ORDER — AMPHETAMINE-DEXTROAMPHETAMINE 15 MG PO TABS: 15.0000 mg | ORAL_TABLET | Freq: Every day | ORAL | 0 refills | Status: DC

## 2021-12-24 MED ORDER — ALBUTEROL SULFATE (2.5 MG/3ML) 0.083% IN NEBU: 2.5000 mg | INHALATION_SOLUTION | Freq: Four times a day (QID) | RESPIRATORY_TRACT | 1 refills | Status: AC | PRN

## 2021-12-24 MED ORDER — PREDNISONE 10 MG PO TABS: ORAL_TABLET | ORAL | 0 refills | Status: DC

## 2021-12-24 MED ORDER — ALBUTEROL SULFATE HFA 108 (90 BASE) MCG/ACT IN AERS: 2.0000 | INHALATION_SPRAY | Freq: Four times a day (QID) | RESPIRATORY_TRACT | 3 refills | Status: DC | PRN

## 2021-12-24 MED ORDER — ONDANSETRON HCL 4 MG PO TABS: 4.0000 mg | ORAL_TABLET | Freq: Three times a day (TID) | ORAL | 2 refills | Status: DC | PRN

## 2021-12-24 MED ORDER — DOXYCYCLINE HYCLATE 100 MG PO CAPS: 100.0000 mg | ORAL_CAPSULE | Freq: Two times a day (BID) | ORAL | 0 refills | Status: DC

## 2021-12-24 MED ORDER — LOVASTATIN 20 MG PO TABS: 20.0000 mg | ORAL_TABLET | Freq: Every day | ORAL | 3 refills | Status: DC

## 2021-12-24 MED ORDER — NADOLOL 20 MG PO TABS: ORAL_TABLET | ORAL | 3 refills | Status: DC

## 2021-12-24 MED ORDER — LISINOPRIL 10 MG PO TABS: 10.0000 mg | ORAL_TABLET | Freq: Every day | ORAL | 3 refills | Status: DC

## 2021-12-24 MED ORDER — FLUTICASONE-SALMETEROL 250-50 MCG/ACT IN AEPB: 1.0000 | INHALATION_SPRAY | Freq: Two times a day (BID) | RESPIRATORY_TRACT | 6 refills | Status: DC

## 2022-05-12 ENCOUNTER — Other Ambulatory Visit: Payer: Self-pay | Admitting: Adult Health

## 2022-05-12 MED ORDER — DOXYCYCLINE HYCLATE 100 MG PO CAPS: 100.0000 mg | ORAL_CAPSULE | Freq: Two times a day (BID) | ORAL | 0 refills | Status: DC

## 2022-05-12 MED ORDER — PREDNISONE 10 MG PO TABS: ORAL_TABLET | ORAL | 0 refills | Status: DC

## 2022-05-16 ENCOUNTER — Telehealth (INDEPENDENT_AMBULATORY_CARE_PROVIDER_SITE_OTHER): Payer: BC Managed Care – PPO | Admitting: Adult Health

## 2022-05-16 ENCOUNTER — Encounter: Payer: Self-pay | Admitting: Adult Health

## 2022-05-16 VITALS — HR 87 | Ht 71.0 in | Wt 215.0 lb

## 2022-05-16 DIAGNOSIS — J189 Pneumonia, unspecified organism: Secondary | ICD-10-CM | POA: Diagnosis not present

## 2022-05-16 MED ORDER — LEVOFLOXACIN 750 MG PO TABS: 750.0000 mg | ORAL_TABLET | Freq: Every day | ORAL | 0 refills | Status: DC

## 2022-05-16 NOTE — Progress Notes (Signed)
Virtual Visit via Video Note  I connected with Johnny Michael on 05/16/22 at 10:30 AM EDT by a video enabled telemedicine application and verified that I am speaking with the correct person using two identifiers.  Location patient: home Location provider:work or home office Persons participating in the virtual visit: patient, provider  I discussed the limitations of evaluation and management by telemedicine and the availability of in person appointments. The patient expressed understanding and agreed to proceed.   HPI:  52 year old male who is being evaluated today for an acute issue.  Symptoms started roughly 7 to 10 days ago while he was on vacation in the [place].  Early on in the vacation he started to notice he was not feeling well and had more shortness of breath than he normally would during a mountain hike.  His symptoms slowly progressed throughout the vacation and he started to have a productive cough, wheezing, rhonchi, and low-grade fever.  He returned to vacation he started doxycycline 100 mg twice daily and has been taking this for 4 days.  He also has been using his nebulizers and inhalers as directed and feels as though the nebulizers helped loosen his secretions but he continues to have a productive cough and rhonchi.  He has also started to experience night sweats. He does have a history of CAP and his symptoms today feel the same as when he has had CAP in the past.   ROS: See pertinent positives and negatives per HPI.  Past Medical History:  Diagnosis Date   ADD (attention deficit disorder)    Asthma    Essential hypertension    GERD (gastroesophageal reflux disease)    Hyperlipidemia     Past Surgical History:  Procedure Laterality Date   VASECTOMY      Family History  Problem Relation Age of Onset   Hypertension Mother    High Cholesterol Mother    High blood pressure Father    Hyperlipidemia Father    Early death Brother        97       Current  Outpatient Medications:    albuterol (PROVENTIL) (2.5 MG/3ML) 0.083% nebulizer solution, Take 3 mLs (2.5 mg total) by nebulization every 6 (six) hours as needed for wheezing or shortness of breath., Disp: 150 mL, Rfl: 1   albuterol (VENTOLIN HFA) 108 (90 Base) MCG/ACT inhaler, Inhale 2 puffs into the lungs every 6 (six) hours as needed for wheezing or shortness of breath., Disp: 6.7 g, Rfl: 3   amphetamine-dextroamphetamine (ADDERALL) 15 MG tablet, Take 1 tablet by mouth daily., Disp: 30 tablet, Rfl: 0   amphetamine-dextroamphetamine (ADDERALL) 15 MG tablet, Take 1 tablet by mouth daily., Disp: 30 tablet, Rfl: 0   amphetamine-dextroamphetamine (ADDERALL) 15 MG tablet, Take 1 tablet by mouth daily., Disp: 30 tablet, Rfl: 0   doxycycline (VIBRAMYCIN) 100 MG capsule, Take 1 capsule (100 mg total) by mouth 2 (two) times daily., Disp: 14 capsule, Rfl: 0   fluticasone-salmeterol (ADVAIR) 250-50 MCG/ACT AEPB, Inhale 1 puff into the lungs in the morning and at bedtime., Disp: 60 each, Rfl: 6   lisinopril (ZESTRIL) 10 MG tablet, Take 1 tablet (10 mg total) by mouth daily., Disp: 90 tablet, Rfl: 3   lovastatin (MEVACOR) 20 MG tablet, Take 1 tablet (20 mg total) by mouth at bedtime., Disp: 90 tablet, Rfl: 3   nadolol (CORGARD) 20 MG tablet, TAKE 1/2 TABLET(10 MG) BY MOUTH DAILY, Disp: 45 tablet, Rfl: 3   ondansetron (ZOFRAN) 4  MG tablet, Take 1 tablet (4 mg total) by mouth every 8 (eight) hours as needed for nausea or vomiting., Disp: 20 tablet, Rfl: 2   pantoprazole (PROTONIX) 40 MG tablet, TAKE ONE TABLET BY MOUTH TWICE DAILY, Disp: 180 tablet, Rfl: 3   predniSONE (DELTASONE) 10 MG tablet, 40 mg x 3 days, 20 mg x 3 days, 10 mg x 3 days, Disp: 21 tablet, Rfl: 0   predniSONE (DELTASONE) 10 MG tablet, 40 mg x 3 days, 20 mg x 3 days, 10 mg x 3 days, Disp: 21 tablet, Rfl: 0  EXAM:  VITALS per patient if applicable:  GENERAL: alert, oriented, appears well and in no acute distress  HEENT: atraumatic,  conjunttiva clear, no obvious abnormalities on inspection of external nose and ears  NECK: normal movements of the head and neck  LUNGS: on inspection no signs of respiratory distress, breathing rate appears normal, no obvious gross SOB, gasping or wheezing  CV: no obvious cyanosis  MS: moves all visible extremities without noticeable abnormality  PSYCH/NEURO: pleasant and cooperative, no obvious depression or anxiety, speech and thought processing grossly intact  ASSESSMENT AND PLAN:  Discussed the following assessment and plan:  1. Pneumonia due to infectious organism, unspecified laterality, unspecified part of lung -Will add on Levaquin to the doxycycline he is already started.  Continue to use nebulizer and inhalers as directed.  Follow-up if no improvement in the next 2 to 3 days. - levofloxacin (LEVAQUIN) 750 MG tablet; Take 1 tablet (750 mg total) by mouth daily.  Dispense: 7 tablet; Refill: 0      I discussed the assessment and treatment plan with the patient. The patient was provided an opportunity to ask questions and all were answered. The patient agreed with the plan and demonstrated an understanding of the instructions.   The patient was advised to call back or seek an in-person evaluation if the symptoms worsen or if the condition fails to improve as anticipated.   Shirline Frees, NP

## 2022-05-24 ENCOUNTER — Ambulatory Visit: Payer: BC Managed Care – PPO | Admitting: Adult Health

## 2022-05-24 VITALS — BP 120/64 | HR 73 | Temp 98.0°F | Ht 71.0 in | Wt 220.0 lb

## 2022-05-24 DIAGNOSIS — E782 Mixed hyperlipidemia: Secondary | ICD-10-CM

## 2022-05-24 DIAGNOSIS — I10 Essential (primary) hypertension: Secondary | ICD-10-CM | POA: Diagnosis not present

## 2022-05-24 DIAGNOSIS — F988 Other specified behavioral and emotional disorders with onset usually occurring in childhood and adolescence: Secondary | ICD-10-CM | POA: Diagnosis not present

## 2022-05-24 DIAGNOSIS — Z0000 Encounter for general adult medical examination without abnormal findings: Secondary | ICD-10-CM | POA: Diagnosis not present

## 2022-05-24 DIAGNOSIS — Z125 Encounter for screening for malignant neoplasm of prostate: Secondary | ICD-10-CM | POA: Diagnosis not present

## 2022-05-24 DIAGNOSIS — J4520 Mild intermittent asthma, uncomplicated: Secondary | ICD-10-CM | POA: Diagnosis not present

## 2022-05-24 DIAGNOSIS — K219 Gastro-esophageal reflux disease without esophagitis: Secondary | ICD-10-CM | POA: Diagnosis not present

## 2022-05-24 DIAGNOSIS — N529 Male erectile dysfunction, unspecified: Secondary | ICD-10-CM

## 2022-05-24 MED ORDER — SILDENAFIL CITRATE 100 MG PO TABS
50.0000 mg | ORAL_TABLET | Freq: Every day | ORAL | 3 refills | Status: AC | PRN
Start: 2022-05-24 — End: ?

## 2022-05-24 NOTE — Progress Notes (Signed)
Subjective:    Patient ID: Johnny Michael, male    DOB: 12/20/1970, 52 y.o.   MRN: [id number]  HPI  Patient presents for yearly preventative medicine examination. He is a pleasant 52 year old male who  has a past medical history of ADD (attention deficit disorder), Asthma, Essential hypertension, GERD (gastroesophageal reflux disease), and Hyperlipidemia.  ADHD -controlled with Adderall 15 mg daily.  Essential Hypertension -prescribed lisinopril 10 mg and Corgard 10 mg daily.  He denies dizziness, lightheadedness, chest pain, shortness of breath BP Readings from Last 3 Encounters:  05/24/22 120/64  08/16/20 110/90  05/21/19 110/70   GERD - takes Prilosec 20 mg daily.   Hyperlipidemia - controlled with lovastatin 20 mg daily. He denies myalgia or fatigue  Lab Results  Component Value Date   CHOL 193 08/17/2020   HDL 41 08/17/2020   LDLCALC 138 (H) 08/17/2020   TRIG 78 08/17/2020   CHOLHDL 5 05/21/2019   ED- has trouble maintaining an erection.   All immunizations and health maintenance protocols were reviewed with the patient and needed orders were placed.  Appropriate screening laboratory values were ordered for the patient including screening of hyperlipidemia, renal function and hepatic function. If indicated by BPH, a PSA was ordered.  Medication reconciliation,  past medical history, social history, problem list and allergies were reviewed in detail with the patient  Goals were established with regard to weight loss, exercise, and  diet in compliance with medications  End of life planning was discussed.  He is up to date on routine colon cancer screening - had cologuard in 2022.   Review of Systems  Constitutional: Negative.   HENT: Negative.    Eyes: Negative.   Respiratory: Negative.    Cardiovascular: Negative.   Gastrointestinal: Negative.   Endocrine: Negative.   Genitourinary: Negative.   Musculoskeletal: Negative.   Skin: Negative.    Allergic/Immunologic: Negative.   Neurological: Negative.   Hematological: Negative.   Psychiatric/Behavioral: Negative.    All other systems reviewed and are negative.  Past Medical History:  Diagnosis Date   ADD (attention deficit disorder)    Asthma    Essential hypertension    GERD (gastroesophageal reflux disease)    Hyperlipidemia     Social History   Socioeconomic History   Marital status: Single    Spouse name: Not on file   Number of children: Not on file   Years of education: Not on file   Highest education level: Not on file  Occupational History   Occupation: Nurse    Employer: [employer]  Tobacco Use   Smoking status: Never   Smokeless tobacco: Never  Substance and Sexual Activity   Alcohol use: Not on file    Comment: socially    Drug use: Never   Sexual activity: Yes    Partners: Female  Other Topics Concern   Not on file  Social History Narrative   Not on file   Social Determinants of Health   Financial Resource Strain: Patient Declined (05/23/2022)   Overall Financial Resource Strain (CARDIA)    Difficulty of Paying Living Expenses: Patient declined  Food Insecurity: Patient Declined (05/23/2022)   Hunger Vital Sign    Worried About Running Out of Food in the Last Year: Patient declined    Ran Out of Food in the Last Year: Patient declined  Transportation Needs: Patient Declined (05/23/2022)   PRAPARE - Administrator, Civil Service (Medical): Patient declined    Lack  of Transportation (Non-Medical): Patient declined  Physical Activity: Insufficiently Active (05/23/2022)   Exercise Vital Sign    Days of Exercise per Week: 3 days    Minutes of Exercise per Session: 30 min  Stress: No Stress Concern Present (05/23/2022)   [employer] of Occupational Health - Occupational Stress Questionnaire    Feeling of Stress : Not at all  Social Connections: Unknown (05/23/2022)   Social Connection and Isolation Panel [NHANES]    Frequency of  Communication with Friends and Family: Never    Frequency of Social Gatherings with Friends and Family: Never    Attends Religious Services: Never    Database administrator or Organizations: No    Attends Engineer, structural: Not on file    Marital Status: Patient declined  Catering manager Violence: Not on file    Past Surgical History:  Procedure Laterality Date   VASECTOMY      Family History  Problem Relation Age of Onset   Hypertension Mother    High Cholesterol Mother    High blood pressure Father    Hyperlipidemia Father    Early death Brother        mva    Allergies  Allergen Reactions   Amoxicillin Rash    Current Outpatient Medications on File Prior to Visit  Medication Sig Dispense Refill   albuterol (PROVENTIL) (2.5 MG/3ML) 0.083% nebulizer solution Take 3 mLs (2.5 mg total) by nebulization every 6 (six) hours as needed for wheezing or shortness of breath. 150 mL 1   albuterol (VENTOLIN HFA) 108 (90 Base) MCG/ACT inhaler Inhale 2 puffs into the lungs every 6 (six) hours as needed for wheezing or shortness of breath. 6.7 g 3   amphetamine-dextroamphetamine (ADDERALL) 15 MG tablet Take 1 tablet by mouth daily. 30 tablet 0   amphetamine-dextroamphetamine (ADDERALL) 15 MG tablet Take 1 tablet by mouth daily. 30 tablet 0   amphetamine-dextroamphetamine (ADDERALL) 15 MG tablet Take 1 tablet by mouth daily. 30 tablet 0   fluticasone-salmeterol (ADVAIR) 250-50 MCG/ACT AEPB Inhale 1 puff into the lungs in the morning and at bedtime. 60 each 6   lisinopril (ZESTRIL) 10 MG tablet Take 1 tablet (10 mg total) by mouth daily. 90 tablet 3   lovastatin (MEVACOR) 20 MG tablet Take 1 tablet (20 mg total) by mouth at bedtime. 90 tablet 3   nadolol (CORGARD) 20 MG tablet TAKE 1/2 TABLET(10 MG) BY MOUTH DAILY 45 tablet 3   pantoprazole (PROTONIX) 40 MG tablet TAKE ONE TABLET BY MOUTH TWICE DAILY 180 tablet 3   ondansetron (ZOFRAN) 4 MG tablet Take 1 tablet (4 mg total) by  mouth every 8 (eight) hours as needed for nausea or vomiting. (Patient not taking: Reported on 05/24/2022) 20 tablet 2   No current facility-administered medications on file prior to visit.    BP 120/64   Pulse 73   Temp 98 F (36.7 C) (Oral)   Ht 5\' 11"  (1.803 m)   Wt 220 lb (99.8 kg)   SpO2 97%   BMI 30.68 kg/m       Objective:   Physical Exam Vitals and nursing note reviewed.  Constitutional:      General: He is not in acute distress.    Appearance: Normal appearance. He is not ill-appearing.  HENT:     Head: Normocephalic and atraumatic.     Right Ear: Tympanic membrane, ear canal and external ear normal. There is no impacted cerumen.     Left Ear: Tympanic  membrane, ear canal and external ear normal. There is no impacted cerumen.     Nose: Nose normal. No congestion or rhinorrhea.     Mouth/Throat:     Mouth: Mucous membranes are moist.     Pharynx: Oropharynx is clear.  Eyes:     Extraocular Movements: Extraocular movements intact.     Conjunctiva/sclera: Conjunctivae normal.     Pupils: Pupils are equal, round, and reactive to light.  Neck:     Vascular: No carotid bruit.  Cardiovascular:     Rate and Rhythm: Normal rate and regular rhythm.     Pulses: Normal pulses.     Heart sounds: No murmur heard.    No friction rub. No gallop.  Pulmonary:     Effort: Pulmonary effort is normal.     Breath sounds: Normal breath sounds.  Abdominal:     General: Abdomen is flat. Bowel sounds are normal. There is no distension.     Palpations: Abdomen is soft. There is no mass.     Tenderness: There is no abdominal tenderness. There is no guarding or rebound.     Hernia: No hernia is present.  Musculoskeletal:        General: Normal range of motion.     Cervical back: Normal range of motion and neck supple.  Lymphadenopathy:     Cervical: No cervical adenopathy.  Skin:    General: Skin is warm and dry.     Capillary Refill: Capillary refill takes less than 2 seconds.   Neurological:     General: No focal deficit present.     Mental Status: He is alert and oriented to person, place, and time.  Psychiatric:        Mood and Affect: Mood normal.        Behavior: Behavior normal.        Thought Content: Thought content normal.        Judgment: Judgment normal.       Assessment & Plan:   1. Routine general medical examination at a health care facility Today patient counseled on age appropriate routine health concerns for screening and prevention, each reviewed and up to date or declined. Immunizations reviewed and up to date or declined. Labs ordered and reviewed. Risk factors for depression reviewed and negative. Hearing function and visual acuity are intact. ADLs screened and addressed as needed. Functional ability and level of safety reviewed and appropriate. Education, counseling and referrals performed based on assessed risks today. Patient provided with a copy of personalized plan for preventive services. - Will hold off on shingles vaccination  - Follow up in one year   2. Attention deficit disorder, unspecified hyperactivity presence - Continue with Adderall  as directed   3. Mixed hyperlipidemia - He ate today, will hold off on lipid panel  - Hemoglobin A1c; Future - CBC; Future - Basic Metabolic Panel; Future - Basic Metabolic Panel - CBC - Hemoglobin A1c  4. Mild intermittent asthma without complication - Continue nebulizer and inhaler as needed - For home use only DME Nebulizer machine - Hemoglobin A1c; Future - CBC; Future - Basic Metabolic Panel; Future - Basic Metabolic Panel - CBC - Hemoglobin A1c  5. Essential hypertension - Well controlled. No change in medication  - Hemoglobin A1c; Future - CBC; Future - Basic Metabolic Panel; Future - Basic Metabolic Panel - CBC - Hemoglobin A1c  6. Gastroesophageal reflux disease, unspecified whether esophagitis present - Continue PPI  - Hemoglobin A1c; Future - CBC;  Future -  Basic Metabolic Panel; Future - Basic Metabolic Panel - CBC - Hemoglobin A1c  7. Prostate cancer screening  - PSA; Future - PSA  8. Erectile dysfunction, unspecified erectile dysfunction type  - sildenafil (VIAGRA) 100 MG tablet; Take 0.5-1 tablets (50-100 mg total) by mouth daily as needed for erectile dysfunction.  Dispense: 10 tablet; Refill: 3   Shirline Frees, NP

## 2022-05-25 ENCOUNTER — Ambulatory Visit: Payer: Self-pay | Admitting: Adult Health

## 2022-05-25 LAB — HEMOGLOBIN A1C: Hgb A1c MFr Bld: 6.1 % (ref 4.6–6.5)

## 2022-05-25 LAB — BASIC METABOLIC PANEL
BUN: 12 mg/dL (ref 6–23)
CO2: 25 mEq/L (ref 19–32)
Calcium: 9.3 mg/dL (ref 8.4–10.5)
Chloride: 105 mEq/L (ref 96–112)
Creatinine, Ser: 0.87 mg/dL (ref 0.40–1.50)
GFR: 99.38 mL/min (ref >60.00)
Glucose, Bld: 98 mg/dL (ref 70–99)
Potassium: 4 mEq/L (ref 3.5–5.1)
Sodium: 142 mEq/L (ref 135–145)

## 2022-05-25 LAB — CBC
HCT: 43.7 % (ref 39.0–52.0)
Hemoglobin: 14.9 g/dL (ref 13.0–17.0)
MCHC: 34 g/dL (ref 30.0–36.0)
MCV: 92.4 fl (ref 78.0–100.0)
Platelets: 232 10*3/uL (ref 150.0–400.0)
RBC: 4.73 Mil/uL (ref 4.22–5.81)
RDW: 13.1 % (ref 11.5–15.5)
WBC: 8.6 10*3/uL (ref 4.0–10.5)

## 2022-05-25 LAB — PSA: PSA: 0.31 ng/mL (ref 0.10–4.00)

## 2022-07-11 ENCOUNTER — Ambulatory Visit: Payer: BC Managed Care – PPO | Admitting: Adult Health

## 2022-07-11 ENCOUNTER — Encounter: Payer: Self-pay | Admitting: Adult Health

## 2022-07-11 ENCOUNTER — Telehealth (INDEPENDENT_AMBULATORY_CARE_PROVIDER_SITE_OTHER): Payer: BC Managed Care – PPO | Admitting: Adult Health

## 2022-07-11 VITALS — Ht 71.0 in | Wt 210.0 lb

## 2022-07-11 DIAGNOSIS — J988 Other specified respiratory disorders: Secondary | ICD-10-CM | POA: Diagnosis not present

## 2022-07-11 MED ORDER — DOXYCYCLINE HYCLATE 100 MG PO CAPS
100.0000 mg | ORAL_CAPSULE | Freq: Two times a day (BID) | ORAL | 0 refills | Status: DC
Start: 2022-07-11 — End: 2022-10-09

## 2022-07-11 MED ORDER — PREDNISONE 10 MG PO TABS
ORAL_TABLET | ORAL | 0 refills | Status: DC
Start: 2022-07-11 — End: 2022-10-09

## 2022-07-11 NOTE — Progress Notes (Signed)
Virtual Visit via Video Note  I connected with Johnny Michael on 07/11/22 at  7:30 AM EDT by a video enabled telemedicine application and verified that I am speaking with the correct person using two identifiers.  Location patient: home Location provider:work or home office Persons participating in the virtual visit: patient, provider  I discussed the limitations of evaluation and management by telemedicine and the availability of in person appointments. The patient expressed understanding and agreed to proceed.   HPI:  52 year old male who  has a past medical history of ADD (attention deficit disorder), Asthma, Essential hypertension, GERD (gastroesophageal reflux disease), and Hyperlipidemia.  Being evaluated today for an acute issue.  Reports that over the last 5 days he has had sinus pressure, postnasal drip, shortness of breath, wheezing, nonproductive cough.  He does have a history of pneumonia and asthma.  He has been using his inhalers and nebulizers as directed with some relief.  Denies fevers or chills.   ROS: See pertinent positives and negatives per HPI.  Past Medical History:  Diagnosis Date   ADD (attention deficit disorder)    Asthma    Essential hypertension    GERD (gastroesophageal reflux disease)    Hyperlipidemia     Past Surgical History:  Procedure Laterality Date   VASECTOMY      Family History  Problem Relation Age of Onset   Hypertension Mother    High Cholesterol Mother    High blood pressure Father    Hyperlipidemia Father    Early death Brother        69       Current Outpatient Medications:    albuterol (PROVENTIL) (2.5 MG/3ML) 0.083% nebulizer solution, Take 3 mLs (2.5 mg total) by nebulization every 6 (six) hours as needed for wheezing or shortness of breath., Disp: 150 mL, Rfl: 1   albuterol (VENTOLIN HFA) 108 (90 Base) MCG/ACT inhaler, Inhale 2 puffs into the lungs every 6 (six) hours as needed for wheezing or shortness of breath., Disp:  6.7 g, Rfl: 3   amphetamine-dextroamphetamine (ADDERALL) 15 MG tablet, Take 1 tablet by mouth daily., Disp: 30 tablet, Rfl: 0   amphetamine-dextroamphetamine (ADDERALL) 15 MG tablet, Take 1 tablet by mouth daily., Disp: 30 tablet, Rfl: 0   amphetamine-dextroamphetamine (ADDERALL) 15 MG tablet, Take 1 tablet by mouth daily., Disp: 30 tablet, Rfl: 0   fluticasone-salmeterol (ADVAIR) 250-50 MCG/ACT AEPB, Inhale 1 puff into the lungs in the morning and at bedtime., Disp: 60 each, Rfl: 6   lisinopril (ZESTRIL) 10 MG tablet, Take 1 tablet (10 mg total) by mouth daily., Disp: 90 tablet, Rfl: 3   lovastatin (MEVACOR) 20 MG tablet, Take 1 tablet (20 mg total) by mouth at bedtime., Disp: 90 tablet, Rfl: 3   nadolol (CORGARD) 20 MG tablet, TAKE 1/2 TABLET(10 MG) BY MOUTH DAILY, Disp: 45 tablet, Rfl: 3   ondansetron (ZOFRAN) 4 MG tablet, Take 1 tablet (4 mg total) by mouth every 8 (eight) hours as needed for nausea or vomiting., Disp: 20 tablet, Rfl: 2   pantoprazole (PROTONIX) 40 MG tablet, TAKE ONE TABLET BY MOUTH TWICE DAILY, Disp: 180 tablet, Rfl: 3   sildenafil (VIAGRA) 100 MG tablet, Take 0.5-1 tablets (50-100 mg total) by mouth daily as needed for erectile dysfunction., Disp: 10 tablet, Rfl: 3  EXAM:  VITALS per patient if applicable:  GENERAL: alert, oriented, appears well and in no acute distress  HEENT: atraumatic, conjunttiva clear, no obvious abnormalities on inspection of external nose and ears  NECK: normal movements of the head and neck  LUNGS: on inspection no signs of respiratory distress, breathing rate appears normal, no obvious gross SOB, gasping or wheezing  CV: no obvious cyanosis  MS: moves all visible extremities without noticeable abnormality  PSYCH/NEURO: pleasant and cooperative, no obvious depression or anxiety, speech and thought processing grossly intact  ASSESSMENT AND PLAN:  Discussed the following assessment and plan:  1. Respiratory infection - Will treat with  prednisone and doxycycline for suspected respiratory infection. Cover for PNA - predniSONE (DELTASONE) 10 MG tablet; 40 mg x 3 days, 20 mg x 3 days, 10 mg x 3 days  Dispense: 21 tablet; Refill: 0 - doxycycline (VIBRAMYCIN) 100 MG capsule; Take 1 capsule (100 mg total) by mouth 2 (two) times daily.  Dispense: 14 capsule; Refill: 0 - Follow up if not improving over the next 2-3 days      I discussed the assessment and treatment plan with the patient. The patient was provided an opportunity to ask questions and all were answered. The patient agreed with the plan and demonstrated an understanding of the instructions.   The patient was advised to call back or seek an in-person evaluation if the symptoms worsen or if the condition fails to improve as anticipated.   Shirline Frees, NP

## 2022-10-09 ENCOUNTER — Other Ambulatory Visit: Payer: Self-pay | Admitting: Adult Health

## 2022-10-09 MED ORDER — SULFAMETHOXAZOLE-TRIMETHOPRIM 800-160 MG PO TABS: 1.0000 | ORAL_TABLET | Freq: Two times a day (BID) | ORAL | 0 refills | Status: AC

## 2022-10-23 ENCOUNTER — Telehealth: Payer: BC Managed Care – PPO | Admitting: Adult Health

## 2022-10-23 ENCOUNTER — Encounter: Payer: Self-pay | Admitting: Adult Health

## 2022-10-23 VITALS — Ht 71.0 in | Wt 220.0 lb

## 2022-10-23 DIAGNOSIS — E782 Mixed hyperlipidemia: Secondary | ICD-10-CM | POA: Diagnosis not present

## 2022-10-23 DIAGNOSIS — F900 Attention-deficit hyperactivity disorder, predominantly inattentive type: Secondary | ICD-10-CM

## 2022-10-23 DIAGNOSIS — J4520 Mild intermittent asthma, uncomplicated: Secondary | ICD-10-CM

## 2022-10-23 DIAGNOSIS — K219 Gastro-esophageal reflux disease without esophagitis: Secondary | ICD-10-CM

## 2022-10-23 DIAGNOSIS — I10 Essential (primary) hypertension: Secondary | ICD-10-CM | POA: Diagnosis not present

## 2022-10-23 MED ORDER — LISINOPRIL 10 MG PO TABS
10.0000 mg | ORAL_TABLET | Freq: Every day | ORAL | 3 refills | Status: DC
Start: 2022-10-23 — End: 2023-08-21

## 2022-10-23 MED ORDER — FLUTICASONE-SALMETEROL 250-50 MCG/ACT IN AEPB: 1.0000 | INHALATION_SPRAY | Freq: Two times a day (BID) | RESPIRATORY_TRACT | 6 refills | Status: DC

## 2022-10-23 MED ORDER — AMPHETAMINE-DEXTROAMPHETAMINE 15 MG PO TABS: 15.0000 mg | ORAL_TABLET | Freq: Every day | ORAL | 0 refills | Status: AC

## 2022-10-23 MED ORDER — LOVASTATIN 20 MG PO TABS
20.0000 mg | ORAL_TABLET | Freq: Every day | ORAL | 3 refills | Status: DC
Start: 2022-10-23 — End: 2023-08-21

## 2022-10-23 MED ORDER — ALBUTEROL SULFATE HFA 108 (90 BASE) MCG/ACT IN AERS: 2.0000 | INHALATION_SPRAY | Freq: Four times a day (QID) | RESPIRATORY_TRACT | 3 refills | Status: DC | PRN

## 2022-10-23 MED ORDER — NADOLOL 20 MG PO TABS
ORAL_TABLET | ORAL | 3 refills | Status: DC
Start: 2022-10-23 — End: 2023-08-21

## 2022-10-23 MED ORDER — PANTOPRAZOLE SODIUM 40 MG PO TBEC: 40.0000 mg | DELAYED_RELEASE_TABLET | Freq: Two times a day (BID) | ORAL | 3 refills | Status: DC

## 2022-10-23 NOTE — Progress Notes (Signed)
Virtual Visit via Video Note  I connected with Johnny Michael on 10/23/22 at 11:15 AM EDT by a video enabled telemedicine application and verified that I am speaking with the correct person using two identifiers.  Location patient: home Location provider:work or home office Persons participating in the virtual visit: patient, provider  I discussed the limitations of evaluation and management by telemedicine and the availability of in person appointments. The patient expressed understanding and agreed to proceed.   HPI: 52 year old male who is being evaluated today for medication refills for his chronic issues  ADHD -controlled with Adderall 15 mg daily.  Essential Hypertension -prescribed lisinopril 10 mg and Corgard 10 mg daily.  He denies dizziness, lightheadedness, chest pain, shortness of breath BP Readings from Last 3 Encounters:  05/24/22 120/64  08/16/20 110/90  05/21/19 110/70    GERD - takes Prilosec 20 mg daily.   Hyperlipidemia - controlled with lovastatin 20 mg daily. He denies myalgia or fatigue  Lab Results  Component Value Date   CHOL 193 08/17/2020   HDL 41 08/17/2020   LDLCALC 138 (H) 08/17/2020   TRIG 78 08/17/2020   CHOLHDL 5 05/21/2019    Asthma - he uses Advair daily and has a rescue inhaler PRN. He also uses Albuterol nebulizer PRN.   ROS: See pertinent positives and negatives per HPI.  Past Medical History:  Diagnosis Date   ADD (attention deficit disorder)    Asthma    Essential hypertension    GERD (gastroesophageal reflux disease)    Hyperlipidemia     Past Surgical History:  Procedure Laterality Date   VASECTOMY      Family History  Problem Relation Age of Onset   Hypertension Mother    High Cholesterol Mother    High blood pressure Father    Hyperlipidemia Father    Early death Brother        59       Current Outpatient Medications:    albuterol (PROVENTIL) (2.5 MG/3ML) 0.083% nebulizer solution, Take 3 mLs (2.5 mg total) by  nebulization every 6 (six) hours as needed for wheezing or shortness of breath., Disp: 150 mL, Rfl: 1   albuterol (VENTOLIN HFA) 108 (90 Base) MCG/ACT inhaler, Inhale 2 puffs into the lungs every 6 (six) hours as needed for wheezing or shortness of breath., Disp: 6.7 g, Rfl: 3   amphetamine-dextroamphetamine (ADDERALL) 15 MG tablet, Take 1 tablet by mouth daily., Disp: 30 tablet, Rfl: 0   amphetamine-dextroamphetamine (ADDERALL) 15 MG tablet, Take 1 tablet by mouth daily., Disp: 30 tablet, Rfl: 0   amphetamine-dextroamphetamine (ADDERALL) 15 MG tablet, Take 1 tablet by mouth daily., Disp: 30 tablet, Rfl: 0   fluticasone-salmeterol (ADVAIR) 250-50 MCG/ACT AEPB, Inhale 1 puff into the lungs in the morning and at bedtime., Disp: 60 each, Rfl: 6   lisinopril (ZESTRIL) 10 MG tablet, Take 1 tablet (10 mg total) by mouth daily., Disp: 90 tablet, Rfl: 3   lovastatin (MEVACOR) 20 MG tablet, Take 1 tablet (20 mg total) by mouth at bedtime., Disp: 90 tablet, Rfl: 3   nadolol (CORGARD) 20 MG tablet, TAKE 1/2 TABLET(10 MG) BY MOUTH DAILY, Disp: 45 tablet, Rfl: 3   ondansetron (ZOFRAN) 4 MG tablet, Take 1 tablet (4 mg total) by mouth every 8 (eight) hours as needed for nausea or vomiting., Disp: 20 tablet, Rfl: 2   pantoprazole (PROTONIX) 40 MG tablet, TAKE ONE TABLET BY MOUTH TWICE DAILY, Disp: 180 tablet, Rfl: 3   sildenafil (VIAGRA) 100 MG tablet,  Take 0.5-1 tablets (50-100 mg total) by mouth daily as needed for erectile dysfunction., Disp: 10 tablet, Rfl: 3  EXAM:  VITALS per patient if applicable:  GENERAL: alert, oriented, appears well and in no acute distress  HEENT: atraumatic, conjunttiva clear, no obvious abnormalities on inspection of external nose and ears  NECK: normal movements of the head and neck  LUNGS: on inspection no signs of respiratory distress, breathing rate appears normal, no obvious gross SOB, gasping or wheezing  CV: no obvious cyanosis  MS: moves all visible extremities  without noticeable abnormality  PSYCH/NEURO: pleasant and cooperative, no obvious depression or anxiety, speech and thought processing grossly intact  ASSESSMENT AND PLAN:  Discussed the following assessment and plan:  1. Attention deficit hyperactivity disorder (ADHD), predominantly inattentive type  - amphetamine-dextroamphetamine (ADDERALL) 15 MG tablet; Take 1 tablet by mouth daily.  Dispense: 30 tablet; Refill: 0 - amphetamine-dextroamphetamine (ADDERALL) 15 MG tablet; Take 1 tablet by mouth daily.  Dispense: 30 tablet; Refill: 0 - amphetamine-dextroamphetamine (ADDERALL) 15 MG tablet; Take 1 tablet by mouth daily.  Dispense: 30 tablet; Refill: 0  2. Mild intermittent asthma without complication  - fluticasone-salmeterol (ADVAIR) 250-50 MCG/ACT AEPB; Inhale 1 puff into the lungs in the morning and at bedtime.  Dispense: 60 each; Refill: 6 - albuterol (VENTOLIN HFA) 108 (90 Base) MCG/ACT inhaler; Inhale 2 puffs into the lungs every 6 (six) hours as needed for wheezing or shortness of breath.  Dispense: 6.7 g; Refill: 3  3. Mixed hyperlipidemia  - lovastatin (MEVACOR) 20 MG tablet; Take 1 tablet (20 mg total) by mouth at bedtime.  Dispense: 90 tablet; Refill: 3  4. Essential hypertension  - lisinopril (ZESTRIL) 10 MG tablet; Take 1 tablet (10 mg total) by mouth daily.  Dispense: 90 tablet; Refill: 3 - nadolol (CORGARD) 20 MG tablet; TAKE 1/2 TABLET(10 MG) BY MOUTH DAILY  Dispense: 45 tablet; Refill: 3  5. Gastroesophageal reflux disease, unspecified whether esophagitis present  - pantoprazole (PROTONIX) 40 MG tablet; Take 1 tablet (40 mg total) by mouth 2 (two) times daily.  Dispense: 180 tablet; Refill: 3     I discussed the assessment and treatment plan with the patient. The patient was provided an opportunity to ask questions and all were answered. The patient agreed with the plan and demonstrated an understanding of the instructions.   The patient was advised to call back  or seek an in-person evaluation if the symptoms worsen or if the condition fails to improve as anticipated.   Shirline Frees, NP

## 2022-10-30 ENCOUNTER — Other Ambulatory Visit (HOSPITAL_COMMUNITY): Payer: Self-pay

## 2022-12-26 ENCOUNTER — Other Ambulatory Visit: Payer: Self-pay | Admitting: Adult Health

## 2022-12-26 MED ORDER — PREDNISONE 10 MG PO TABS: ORAL_TABLET | ORAL | 0 refills | Status: DC

## 2022-12-26 MED ORDER — DOXYCYCLINE HYCLATE 100 MG PO CAPS: 100.0000 mg | ORAL_CAPSULE | Freq: Two times a day (BID) | ORAL | 0 refills | Status: DC

## 2023-02-20 ENCOUNTER — Other Ambulatory Visit (HOSPITAL_COMMUNITY): Payer: Self-pay

## 2023-02-20 ENCOUNTER — Telehealth: Payer: Self-pay

## 2023-02-20 NOTE — Telephone Encounter (Signed)
Pharmacy Patient Advocate Encounter   Received notification from CoverMyMeds that prior authorization for Amphetamine-Dextroamphetamine 15MG  tablets is required/requested.   Insurance verification completed.   The patient is insured through [facility] ADVANTAGE/RX ADVANCE .   Per test claim: PA required; PA submitted to above mentioned insurance via CoverMyMeds Key/confirmation #/EOC [id number] Status is pending

## 2023-02-21 ENCOUNTER — Other Ambulatory Visit (HOSPITAL_COMMUNITY): Payer: Self-pay

## 2023-02-21 NOTE — Telephone Encounter (Signed)
Pharmacy Patient Advocate Encounter  Received notification from [facility] that Prior Authorization for Amphetamine-Dextroamphetamine 15MG  tablets has been APPROVED from 02/20/23 to 02/19/26. Ran test claim, Copay is $5. This test claim was processed through [facility]- copay amounts may vary at other pharmacies due to pharmacy/plan contracts, or as the patient moves through the different stages of their insurance plan.   PA #/Case ID/Reference #: [id number]  *spoke with [facility] pharmacy

## 2023-04-02 ENCOUNTER — Other Ambulatory Visit: Payer: Self-pay | Admitting: Adult Health

## 2023-04-02 MED ORDER — ONDANSETRON HCL 8 MG PO TABS: 8.0000 mg | ORAL_TABLET | Freq: Three times a day (TID) | ORAL | 0 refills | Status: DC | PRN

## 2023-08-21 ENCOUNTER — Ambulatory Visit (INDEPENDENT_AMBULATORY_CARE_PROVIDER_SITE_OTHER): Admitting: Adult Health

## 2023-08-21 ENCOUNTER — Encounter: Payer: Self-pay | Admitting: Adult Health

## 2023-08-21 VITALS — BP 110/80 | HR 60 | Temp 97.9°F | Ht 71.0 in | Wt 206.0 lb

## 2023-08-21 DIAGNOSIS — Z23 Encounter for immunization: Secondary | ICD-10-CM | POA: Diagnosis not present

## 2023-08-21 DIAGNOSIS — E782 Mixed hyperlipidemia: Secondary | ICD-10-CM | POA: Diagnosis not present

## 2023-08-21 DIAGNOSIS — I10 Essential (primary) hypertension: Secondary | ICD-10-CM | POA: Diagnosis not present

## 2023-08-21 DIAGNOSIS — Z0000 Encounter for general adult medical examination without abnormal findings: Secondary | ICD-10-CM

## 2023-08-21 DIAGNOSIS — K219 Gastro-esophageal reflux disease without esophagitis: Secondary | ICD-10-CM

## 2023-08-21 DIAGNOSIS — F900 Attention-deficit hyperactivity disorder, predominantly inattentive type: Secondary | ICD-10-CM | POA: Diagnosis not present

## 2023-08-21 DIAGNOSIS — J4520 Mild intermittent asthma, uncomplicated: Secondary | ICD-10-CM

## 2023-08-21 DIAGNOSIS — Z125 Encounter for screening for malignant neoplasm of prostate: Secondary | ICD-10-CM

## 2023-08-21 LAB — LIPID PANEL
Cholesterol: 221 mg/dL — ABNORMAL HIGH (ref 0–200)
HDL: 52.4 mg/dL (ref >39.00)
LDL Cholesterol: 137 mg/dL — ABNORMAL HIGH (ref 0–99)
NonHDL: 168.12
Total CHOL/HDL Ratio: 4
Triglycerides: 158 mg/dL — ABNORMAL HIGH (ref 0.0–149.0)
VLDL: 31.6 mg/dL (ref 0.0–40.0)

## 2023-08-21 LAB — PSA: PSA: 0.33 ng/mL (ref 0.10–4.00)

## 2023-08-21 LAB — COMPREHENSIVE METABOLIC PANEL WITH GFR
ALT: 48 U/L (ref 0–53)
AST: 30 U/L (ref 0–37)
Albumin: 4.5 g/dL (ref 3.5–5.2)
Alkaline Phosphatase: 63 U/L (ref 39–117)
BUN: 12 mg/dL (ref 6–23)
CO2: 27 meq/L (ref 19–32)
Calcium: 9.7 mg/dL (ref 8.4–10.5)
Chloride: 105 meq/L (ref 96–112)
Creatinine, Ser: 0.9 mg/dL (ref 0.40–1.50)
GFR: 97.52 mL/min (ref >60.00)
Glucose, Bld: 78 mg/dL (ref 70–99)
Potassium: 4.1 meq/L (ref 3.5–5.1)
Sodium: 142 meq/L (ref 135–145)
Total Bilirubin: 0.5 mg/dL (ref 0.2–1.2)
Total Protein: 6.7 g/dL (ref 6.0–8.3)

## 2023-08-21 LAB — TSH: TSH: 1.27 u[IU]/mL (ref 0.35–5.50)

## 2023-08-21 LAB — CBC
HCT: 44.2 % (ref 39.0–52.0)
Hemoglobin: 14.8 g/dL (ref 13.0–17.0)
MCHC: 33.5 g/dL (ref 30.0–36.0)
MCV: 92.1 fl (ref 78.0–100.0)
Platelets: 216 K/uL (ref 150.0–400.0)
RBC: 4.79 Mil/uL (ref 4.22–5.81)
RDW: 13.4 % (ref 11.5–15.5)
WBC: 7.6 K/uL (ref 4.0–10.5)

## 2023-08-21 MED ORDER — PANTOPRAZOLE SODIUM 40 MG PO TBEC: 40.0000 mg | DELAYED_RELEASE_TABLET | Freq: Two times a day (BID) | ORAL | 3 refills | Status: AC

## 2023-08-21 MED ORDER — LISINOPRIL 10 MG PO TABS
10.0000 mg | ORAL_TABLET | Freq: Every day | ORAL | 3 refills | Status: AC
Start: 2023-08-21 — End: ?

## 2023-08-21 MED ORDER — NADOLOL 20 MG PO TABS: ORAL_TABLET | ORAL | 3 refills | Status: AC

## 2023-08-21 MED ORDER — LOVASTATIN 20 MG PO TABS: 20.0000 mg | ORAL_TABLET | Freq: Every day | ORAL | 3 refills | Status: DC

## 2023-08-21 MED ORDER — ALBUTEROL SULFATE HFA 108 (90 BASE) MCG/ACT IN AERS: 2.0000 | INHALATION_SPRAY | Freq: Four times a day (QID) | RESPIRATORY_TRACT | 3 refills | Status: AC | PRN

## 2023-08-21 NOTE — Progress Notes (Signed)
 Subjective:    Patient ID: Johnny Michael, male    DOB: 12/08/1970, 53 y.o.   MRN: [id number]  HPI  Patient presents for yearly preventative medicine examination. He is a pleasant 53 year old male who  has a past medical history of ADD (attention deficit disorder), Asthma, Essential hypertension, GERD (gastroesophageal reflux disease), and Hyperlipidemia.  ADHD -controlled with Adderall 15 mg daily PRN  Essential Hypertension -prescribed lisinopril  10 mg and Corgard  10 mg daily.  He denies dizziness, lightheadedness, chest pain, shortness of breath BP Readings from Last 3 Encounters:  08/21/23 110/80  05/24/22 120/64  08/16/20 110/90   GERD - takes Prilosec 20 mg daily.   Hyperlipidemia - controlled with lovastatin  20 mg daily. He denies myalgia or fatigue  Lab Results  Component Value Date   CHOL 193 08/17/2020   HDL 41 08/17/2020   LDLCALC 138 (H) 08/17/2020   TRIG 78 08/17/2020   CHOLHDL 5 05/21/2019    All immunizations and health maintenance protocols were reviewed with the patient and needed orders were placed.  Appropriate screening laboratory values were ordered for the patient including screening of hyperlipidemia, renal function and hepatic function. If indicated by BPH, a PSA was ordered.  Medication reconciliation,  past medical history, social history, problem list and allergies were reviewed in detail with the patient  Goals were established with regard to weight loss, exercise, and  diet in compliance with medications. He has changed his diet and is eating high quality protein and more vegetables. He has been able to lose significant weight and feels better over all.  Wt Readings from Last 3 Encounters:  08/21/23 206 lb (93.4 kg)  10/23/22 220 lb (99.8 kg)  07/11/22 210 lb (95.3 kg)    Review of Systems  Constitutional: Negative.   HENT: Negative.    Eyes: Negative.   Respiratory: Negative.    Cardiovascular: Negative.   Gastrointestinal: Negative.    Endocrine: Negative.   Genitourinary: Negative.   Musculoskeletal: Negative.   Skin: Negative.   Allergic/Immunologic: Negative.   Neurological: Negative.   Hematological: Negative.   Psychiatric/Behavioral: Negative.    All other systems reviewed and are negative.  Past Medical History:  Diagnosis Date   ADD (attention deficit disorder)    Asthma    Essential hypertension    GERD (gastroesophageal reflux disease)    Hyperlipidemia     Social History   Socioeconomic History   Marital status: Married    Spouse name: Not on file   Number of children: Not on file   Years of education: Not on file   Highest education level: Not on file  Occupational History   Occupation: Nurse    Employer: [employer]  Tobacco Use   Smoking status: Never   Smokeless tobacco: Never  Substance and Sexual Activity   Alcohol use: Not on file    Comment: socially    Drug use: Never   Sexual activity: Yes    Partners: Female  Other Topics Concern   Not on file  Social History Narrative   Not on file   Social Drivers of Health   Financial Resource Strain: Patient Declined (08/20/2023)   Overall Financial Resource Strain (CARDIA)    Difficulty of Paying Living Expenses: Patient declined  Food Insecurity: Patient Declined (08/20/2023)   Hunger Vital Sign    Worried About Running Out of Food in the Last Year: Patient declined    Ran Out of Food in the Last Year: Patient  declined  Transportation Needs: Patient Declined (08/20/2023)   PRAPARE - Administrator, Civil Service (Medical): Patient declined    Lack of Transportation (Non-Medical): Patient declined  Physical Activity: Insufficiently Active (08/20/2023)   Exercise Vital Sign    Days of Exercise per Week: 2 days    Minutes of Exercise per Session: 20 min  Stress: No Stress Concern Present (08/20/2023)   [employer] of Occupational Health - Occupational Stress Questionnaire    Feeling of Stress: Not at all  Social  Connections: Unknown (08/20/2023)   Social Connection and Isolation Panel    Frequency of Communication with Friends and Family: Patient declined    Frequency of Social Gatherings with Friends and Family: Patient declined    Attends Religious Services: Patient declined    Database administrator or Organizations: Patient declined    Attends Engineer, structural: Not on file    Marital Status: Patient declined  Catering manager Violence: Not on file    Past Surgical History:  Procedure Laterality Date   VASECTOMY      Family History  Problem Relation Age of Onset   Hypertension Mother    High Cholesterol Mother    High blood pressure Father    Hyperlipidemia Father    Early death Brother        mva    Allergies  Allergen Reactions   Amoxicillin Rash    Current Outpatient Medications on File Prior to Visit  Medication Sig Dispense Refill   albuterol  (PROVENTIL ) (2.5 MG/3ML) 0.083% nebulizer solution Take 3 mLs (2.5 mg total) by nebulization every 6 (six) hours as needed for wheezing or shortness of breath. 150 mL 1   albuterol  (VENTOLIN  HFA) 108 (90 Base) MCG/ACT inhaler Inhale 2 puffs into the lungs every 6 (six) hours as needed for wheezing or shortness of breath. 6.7 g 3   amphetamine -dextroamphetamine  (ADDERALL) 15 MG tablet Take 1 tablet by mouth daily. 30 tablet 0   amphetamine -dextroamphetamine  (ADDERALL) 15 MG tablet Take 1 tablet by mouth daily. 30 tablet 0   amphetamine -dextroamphetamine  (ADDERALL) 15 MG tablet Take 1 tablet by mouth daily. 30 tablet 0   fluticasone -salmeterol (ADVAIR) 250-50 MCG/ACT AEPB Inhale 1 puff into the lungs in the morning and at bedtime. 60 each 6   lisinopril  (ZESTRIL ) 10 MG tablet Take 1 tablet (10 mg total) by mouth daily. 90 tablet 3   lovastatin  (MEVACOR ) 20 MG tablet Take 1 tablet (20 mg total) by mouth at bedtime. 90 tablet 3   nadolol  (CORGARD ) 20 MG tablet TAKE 1/2 TABLET(10 MG) BY MOUTH DAILY 45 tablet 3   ondansetron   (ZOFRAN ) 8 MG tablet Take 1 tablet (8 mg total) by mouth every 8 (eight) hours as needed for nausea or vomiting. 30 tablet 0   pantoprazole  (PROTONIX ) 40 MG tablet Take 1 tablet (40 mg total) by mouth 2 (two) times daily. 180 tablet 3   sildenafil  (VIAGRA ) 100 MG tablet Take 0.5-1 tablets (50-100 mg total) by mouth daily as needed for erectile dysfunction. 10 tablet 3   No current facility-administered medications on file prior to visit.    BP 110/80   Pulse 60   Temp 97.9 F (36.6 C) (Oral)   Ht 5' 11 (1.803 m)   Wt 206 lb (93.4 kg)   SpO2 96%   BMI 28.73 kg/m       Objective:   Physical Exam Vitals and nursing note reviewed.  Constitutional:      General: He is  not in acute distress.    Appearance: Normal appearance. He is not ill-appearing.  HENT:     Head: Normocephalic and atraumatic.     Right Ear: Tympanic membrane, ear canal and external ear normal. There is no impacted cerumen.     Left Ear: Tympanic membrane, ear canal and external ear normal. There is no impacted cerumen.     Nose: Nose normal. No congestion or rhinorrhea.     Mouth/Throat:     Mouth: Mucous membranes are moist.     Pharynx: Oropharynx is clear.  Eyes:     Extraocular Movements: Extraocular movements intact.     Conjunctiva/sclera: Conjunctivae normal.     Pupils: Pupils are equal, round, and reactive to light.  Neck:     Vascular: No carotid bruit.  Cardiovascular:     Rate and Rhythm: Normal rate and regular rhythm.     Pulses: Normal pulses.     Heart sounds: No murmur heard.    No friction rub. No gallop.  Pulmonary:     Effort: Pulmonary effort is normal.     Breath sounds: Normal breath sounds.  Abdominal:     General: Abdomen is flat. Bowel sounds are normal. There is no distension.     Palpations: Abdomen is soft. There is no mass.     Tenderness: There is no abdominal tenderness. There is no guarding or rebound.     Hernia: No hernia is present.  Musculoskeletal:         General: Normal range of motion.     Cervical back: Normal range of motion and neck supple.  Lymphadenopathy:     Cervical: No cervical adenopathy.  Skin:    General: Skin is warm and dry.     Capillary Refill: Capillary refill takes less than 2 seconds.  Neurological:     General: No focal deficit present.     Mental Status: He is alert and oriented to person, place, and time.  Psychiatric:        Mood and Affect: Mood normal.        Behavior: Behavior normal.        Thought Content: Thought content normal.        Judgment: Judgment normal.       Assessment & Plan:  1. Routine general medical examination at a health care facility (Primary) Today patient counseled on age appropriate routine health concerns for screening and prevention, each reviewed and up to date or declined. Immunizations reviewed and up to date or declined. Labs ordered and reviewed. Risk factors for depression reviewed and negative. Hearing function and visual acuity are intact. ADLs screened and addressed as needed. Functional ability and level of safety reviewed and appropriate. Education, counseling and referrals performed based on assessed risks today. Patient provided with a copy of personalized plan for preventive services. - Follow up in one year or sooner if needed - Continue with weight loss measures  2. Attention deficit hyperactivity disorder (ADHD), predominantly inattentive type - Continue with Adderall as needed  3. Essential hypertension - Well controlled. No change in medication  - Lipid panel; Future - TSH; Future - CBC; Future - Comprehensive metabolic panel with GFR; Future - Comprehensive metabolic panel with GFR - CBC - TSH - Lipid panel - lisinopril  (ZESTRIL ) 10 MG tablet; Take 1 tablet (10 mg total) by mouth daily.  Dispense: 90 tablet; Refill: 3 - nadolol  (CORGARD ) 20 MG tablet; TAKE 1/2 TABLET(10 MG) BY MOUTH DAILY  Dispense: 45 tablet;  Refill: 3  4. Gastroesophageal reflux  disease, unspecified whether esophagitis present - Controlled with protonix  - Lipid panel; Future - TSH; Future - CBC; Future - Comprehensive metabolic panel with GFR; Future - Comprehensive metabolic panel with GFR - CBC - TSH - Lipid panel - pantoprazole  (PROTONIX ) 40 MG tablet; Take 1 tablet (40 mg total) by mouth 2 (two) times daily.  Dispense: 180 tablet; Refill: 3  5. Mixed hyperlipidemia - Continue with lovastatin  20 mg daily  - Lipid panel; Future - TSH; Future - CBC; Future - Comprehensive metabolic panel with GFR; Future - Comprehensive metabolic panel with GFR - CBC - TSH - Lipid panel - lovastatin  (MEVACOR ) 20 MG tablet; Take 1 tablet (20 mg total) by mouth at bedtime.  Dispense: 90 tablet; Refill: 3  6. Prostate cancer screening  - PSA; Future - PSA  7. Need for pneumococcal vaccine  - Pneumococcal conjugate vaccine 20-valent (Prevnar 20)  8. Mild intermittent asthma without complication  - albuterol  (VENTOLIN  HFA) 108 (90 Base) MCG/ACT inhaler; Inhale 2 puffs into the lungs every 6 (six) hours as needed for wheezing or shortness of breath.  Dispense: 6.7 g; Refill: 3  Darleene Shape, NP

## 2023-08-22 ENCOUNTER — Other Ambulatory Visit: Payer: Self-pay | Admitting: Adult Health

## 2023-08-22 ENCOUNTER — Ambulatory Visit: Payer: Self-pay | Admitting: Adult Health

## 2023-08-22 MED ORDER — ROSUVASTATIN CALCIUM 20 MG PO TABS
20.0000 mg | ORAL_TABLET | Freq: Every day | ORAL | 3 refills | Status: AC
Start: 2023-08-22 — End: ?

## 2023-09-12 ENCOUNTER — Other Ambulatory Visit: Payer: Self-pay | Admitting: Adult Health

## 2023-09-12 DIAGNOSIS — Z1211 Encounter for screening for malignant neoplasm of colon: Secondary | ICD-10-CM

## 2023-09-26 ENCOUNTER — Ambulatory Visit: Payer: Self-pay | Admitting: Adult Health

## 2023-09-26 LAB — COLOGUARD: COLOGUARD: NEGATIVE

## 2023-09-29 ENCOUNTER — Other Ambulatory Visit: Payer: Self-pay | Admitting: Adult Health

## 2023-09-29 MED ORDER — FLUCONAZOLE 200 MG PO TABS: 200.0000 mg | ORAL_TABLET | Freq: Every day | ORAL | 0 refills | Status: AC

## 2024-02-18 ENCOUNTER — Other Ambulatory Visit: Payer: Self-pay | Admitting: Adult Health

## 2024-02-18 MED ORDER — ONDANSETRON HCL 8 MG PO TABS: 8.0000 mg | ORAL_TABLET | Freq: Three times a day (TID) | ORAL | 2 refills | Status: AC | PRN
